# Patient Record
Sex: Male | Born: 1985 | Race: White | Hispanic: No | Marital: Married | Smoking: Current some day smoker
Health system: Southern US, Community
[De-identification: ages and names within clinical notes are randomized; demographics above are authoritative.]

## PROBLEM LIST (undated history)

## (undated) DIAGNOSIS — R319 Hematuria, unspecified: Secondary | ICD-10-CM

## (undated) DIAGNOSIS — N201 Calculus of ureter: Secondary | ICD-10-CM

## (undated) DIAGNOSIS — R3915 Urgency of urination: Secondary | ICD-10-CM

## (undated) DIAGNOSIS — N200 Calculus of kidney: Secondary | ICD-10-CM

## (undated) DIAGNOSIS — Z87442 Personal history of urinary calculi: Secondary | ICD-10-CM

## (undated) HISTORY — DX: Hematuria, unspecified: R31.9

## (undated) HISTORY — PX: NO PAST SURGERIES: SHX2092

---
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
## 2005-04-20 ENCOUNTER — Emergency Department (HOSPITAL_COMMUNITY): Admission: EM | Admit: 2005-04-20 | Discharge: 2005-04-20 | Payer: Self-pay | Admitting: Emergency Medicine

## 2006-12-30 ENCOUNTER — Emergency Department (HOSPITAL_COMMUNITY): Admission: EM | Admit: 2006-12-30 | Discharge: 2006-12-30 | Payer: Self-pay | Admitting: Emergency Medicine

## 2008-04-02 ENCOUNTER — Emergency Department (HOSPITAL_COMMUNITY): Admission: EM | Admit: 2008-04-02 | Discharge: 2008-04-02 | Payer: Self-pay | Admitting: Emergency Medicine

## 2008-04-14 ENCOUNTER — Emergency Department (HOSPITAL_COMMUNITY): Admission: EM | Admit: 2008-04-14 | Discharge: 2008-04-14 | Payer: Self-pay | Admitting: Emergency Medicine

## 2008-10-21 ENCOUNTER — Ambulatory Visit: Payer: Self-pay | Admitting: Family Medicine

## 2008-10-21 LAB — CONVERTED CEMR LAB
Bilirubin Urine: NEGATIVE
Glucose, Urine, Semiquant: NEGATIVE
Ketones, urine, test strip: NEGATIVE
Specific Gravity, Urine: 1.02
Urobilinogen, UA: 0.2
pH: 6

## 2009-04-02 ENCOUNTER — Emergency Department (HOSPITAL_COMMUNITY): Admission: EM | Admit: 2009-04-02 | Discharge: 2009-04-02 | Payer: Self-pay | Admitting: Emergency Medicine

## 2011-03-20 LAB — URINE MICROSCOPIC-ADD ON

## 2011-03-20 LAB — POCT I-STAT, CHEM 8
BUN: 13
Chloride: 107
HCT: 45
Potassium: 3.8
TCO2: 25

## 2011-03-20 LAB — URINALYSIS, ROUTINE W REFLEX MICROSCOPIC
Urobilinogen, UA: 1
pH: 7.5

## 2011-07-04 ENCOUNTER — Emergency Department (HOSPITAL_COMMUNITY)
Admission: EM | Admit: 2011-07-04 | Discharge: 2011-07-04 | Disposition: A | Discharge status: Home / Self Care | Payer: BC Managed Care – PPO | Attending: Emergency Medicine | Admitting: Emergency Medicine

## 2011-07-04 ENCOUNTER — Encounter (HOSPITAL_COMMUNITY): Payer: Self-pay | Admitting: *Deleted

## 2011-07-04 ENCOUNTER — Emergency Department (HOSPITAL_COMMUNITY): Payer: BC Managed Care – PPO

## 2011-07-04 DIAGNOSIS — N23 Unspecified renal colic: Secondary | ICD-10-CM

## 2011-07-04 DIAGNOSIS — R109 Unspecified abdominal pain: Secondary | ICD-10-CM | POA: Insufficient documentation

## 2011-07-04 DIAGNOSIS — N1330 Unspecified hydronephrosis: Secondary | ICD-10-CM | POA: Insufficient documentation

## 2011-07-04 DIAGNOSIS — N201 Calculus of ureter: Secondary | ICD-10-CM | POA: Insufficient documentation

## 2011-07-04 DIAGNOSIS — R0989 Other specified symptoms and signs involving the circulatory and respiratory systems: Secondary | ICD-10-CM | POA: Insufficient documentation

## 2011-07-04 DIAGNOSIS — R0609 Other forms of dyspnea: Secondary | ICD-10-CM | POA: Insufficient documentation

## 2011-07-04 DIAGNOSIS — R61 Generalized hyperhidrosis: Secondary | ICD-10-CM | POA: Insufficient documentation

## 2011-07-04 DIAGNOSIS — R1110 Vomiting, unspecified: Secondary | ICD-10-CM | POA: Insufficient documentation

## 2011-07-04 LAB — URINALYSIS, ROUTINE W REFLEX MICROSCOPIC
Glucose, UA: NEGATIVE mg/dL
Ketones, ur: NEGATIVE mg/dL
Leukocytes, UA: NEGATIVE
Nitrite: NEGATIVE
Urobilinogen, UA: 0.2 mg/dL (ref 0.0–1.0)
pH: 6 (ref 5.0–8.0)

## 2011-07-04 LAB — URINE MICROSCOPIC-ADD ON

## 2011-07-04 LAB — URINE CULTURE: Culture  Setup Time: 201301162004

## 2011-07-04 MED ORDER — HYDROMORPHONE HCL PF 1 MG/ML IJ SOLN
1.0000 mg | Freq: Once | INTRAMUSCULAR | Status: AC
  Administered 2011-07-04: 1 mg via INTRAVENOUS
  Filled 2011-07-04: qty 1

## 2011-07-04 MED ORDER — KETOROLAC TROMETHAMINE 60 MG/2ML IM SOLN
60.0000 mg | Freq: Once | INTRAMUSCULAR | Status: AC
  Administered 2011-07-04: 60 mg via INTRAMUSCULAR
  Filled 2011-07-04: qty 2

## 2011-07-04 MED ORDER — ONDANSETRON HCL 4 MG/2ML IJ SOLN
4.0000 mg | Freq: Once | INTRAMUSCULAR | Status: AC
  Administered 2011-07-04: 4 mg via INTRAVENOUS

## 2011-07-04 MED ORDER — MORPHINE SULFATE 4 MG/ML IJ SOLN
4.0000 mg | INTRAMUSCULAR | Status: DC | PRN
  Filled 2011-07-04: qty 1

## 2011-07-04 MED ORDER — MORPHINE SULFATE 2 MG/ML IJ SOLN
2.0000 mg | INTRAMUSCULAR | Status: DC | PRN
  Filled 2011-07-04 (×2): qty 1

## 2011-07-04 MED ORDER — ONDANSETRON HCL 4 MG PO TABS: 4.0000 mg | ORAL_TABLET | Freq: Four times a day (QID) | ORAL | Status: AC | PRN

## 2011-07-04 MED ORDER — MORPHINE SULFATE 2 MG/ML IJ SOLN
2.0000 mg | Freq: Once | INTRAMUSCULAR | Status: AC
  Administered 2011-07-04: 2 mg via INTRAVENOUS

## 2011-07-04 MED ORDER — SODIUM CHLORIDE 0.9 % IV BOLUS (SEPSIS): 1000.0000 mL | Freq: Once | INTRAVENOUS | Status: DC

## 2011-07-04 MED ORDER — TAMSULOSIN HCL 0.4 MG PO CAPS
0.4000 mg | ORAL_CAPSULE | Freq: Every day | ORAL | Status: DC
  Administered 2011-07-04: 0.4 mg via ORAL
  Filled 2011-07-04: qty 1

## 2011-07-04 MED ORDER — HYDROMORPHONE HCL 2 MG PO TABS
ORAL_TABLET | ORAL | Status: AC
  Filled 2011-07-04: qty 1

## 2011-07-04 MED ORDER — TAMSULOSIN HCL 0.4 MG PO CAPS: 0.4000 mg | ORAL_CAPSULE | Freq: Every day | ORAL | Status: DC

## 2011-07-04 MED ORDER — HYDROMORPHONE HCL 2 MG PO TABS: 1.0000 mg | ORAL_TABLET | ORAL | Status: DC | PRN

## 2011-07-04 MED ORDER — ONDANSETRON HCL 4 MG/2ML IJ SOLN
INTRAMUSCULAR | Status: AC
  Administered 2011-07-04: 4 mg via INTRAVENOUS
  Filled 2011-07-04: qty 2

## 2011-07-04 MED ORDER — OXYCODONE-ACETAMINOPHEN 5-325 MG PO TABS: ORAL_TABLET | ORAL | Status: AC

## 2011-07-04 NOTE — ED Provider Notes (Signed)
History     CSN: [id number]  Arrival date & time 07/04/11  4098   First MD Initiated Contact with Patient 07/04/11 0915      Chief Complaint  Patient presents with  . Flank Pain   HPI  Pt writhing in pain during time of exam. Pt w/ acute onset L lower abdominal pain this am that is sharp and constant in nature w/ some waxing/waning of pain. Pain is diffuse w/o radiation. Denies trauma, fever, recent illness, hematuria. Emesis x1 when EMS arrived at home this morning.    Past Medical History  Diagnosis Date  . Kidney stones     No past surgical history on file.  No family history on file.  Social History: Tobacco 1ppd Etoh: denied Drugs: denied   Review of Systems  Constitutional: Negative for fever, chills, activity change, appetite change and fatigue.  HENT: Negative for congestion and neck stiffness.   Respiratory: Negative for cough, choking, chest tightness, shortness of breath, wheezing and stridor.   Cardiovascular: Negative for chest pain, palpitations and leg swelling.  Gastrointestinal: Positive for abdominal pain. Negative for nausea, vomiting, constipation, blood in stool and abdominal distention.  Genitourinary: Positive for flank pain. Negative for dysuria, urgency, frequency, hematuria, decreased urine volume, discharge, enuresis, difficulty urinating and testicular pain.  Skin: Negative for color change and rash.  Neurological: Negative for dizziness, weakness, numbness and headaches.    Allergies  Review of patient's allergies indicates no known allergies.  Home Medications  No current outpatient prescriptions on file.  BP 121/74  Pulse 54  Temp(Src) 95.6 F (35.3 C) (Oral)  Resp 24  SpO2 100%  Physical Exam  Constitutional: He is oriented to person, place, and time. He appears well-developed and well-nourished. He appears distressed.  HENT:  Head: Normocephalic and atraumatic.  Mouth/Throat: Oropharynx is clear and moist.  Eyes:  Conjunctivae and EOM are normal. Pupils are equal, round, and reactive to light. Right eye exhibits no discharge. Left eye exhibits no discharge. No scleral icterus.  Neck: Normal range of motion.  Cardiovascular: Normal rate, regular rhythm, normal heart sounds and intact distal pulses.  Exam reveals no gallop and no friction rub.   No murmur heard. Pulmonary/Chest: Breath sounds normal. He is in respiratory distress. He has no wheezes. He has no rales.  Abdominal: Soft. Bowel sounds are normal. He exhibits no distension and no mass. There is tenderness. There is no rebound (Left flank tenderness on palpation) and no guarding.       Left flank tenderness on palpation  Musculoskeletal: Normal range of motion.       L CVA tenderness No R CVA tendernss  Neurological: He is alert and oriented to person, place, and time. No cranial nerve deficit.  Skin: Skin is warm. He is diaphoretic.    ED Course  Procedures (including critical care time)   Labs Reviewed  URINALYSIS, ROUTINE W REFLEX MICROSCOPIC  URINE CULTURE   No results found.   No diagnosis found.    MDM   26yo M w/ h/o kidney stone w/ acute onset severe L Flank/CVA pain.   Flank Pain: Likely kidney stone  - Pain: Morphine 2mg  Q4hrs, and toradol 60 IM x1 - CT abdomen/pelvis - UA/UCX - IVF NS bolus - Nausea: zofran IV - continuous pulse ox     Shelly Flatten, MD 07/04/11 1058

## 2011-07-04 NOTE — ED Provider Notes (Signed)
26yo M, c/o sudden onset and persistence of constant left sided flank pain radiating into the left side of his abd that began this morning.  Pt has hx kidney stones, endorses his symptoms today "feels the same."  Has been assoc with N/V.  CT scan with +50mm left UVJ stone, UA without infection.  Feels improved after meds and wants to go home now.  Dx testing d/w pt and family.  Questions answered.  Verb understanding, agreeable to d/c home with outpt f/u.     I saw and evaluated the patient, reviewed the resident's note and I agree with the findings and plan.   Doha Boling Allison Quarry, DO 07/05/11 [phone]

## 2011-07-04 NOTE — ED Notes (Signed)
Patient states he is unable to void at this time. 

## 2011-07-04 NOTE — ED Notes (Signed)
Per EMS pt from home, had sudden onset of left flank pain around 0800. Denies urinary symptoms. Hx of kidney stones. CBG 127.

## 2011-07-05 NOTE — ED Provider Notes (Signed)
I saw and evaluated the patient, reviewed the resident's note and I agree with the findings and plan.  Please seen my separate ED provider note.   Laray Anger, DO 07/05/11 [phone]

## 2016-12-16 ENCOUNTER — Emergency Department (HOSPITAL_COMMUNITY): Payer: Self-pay

## 2016-12-16 ENCOUNTER — Encounter (HOSPITAL_COMMUNITY): Payer: Self-pay | Admitting: Emergency Medicine

## 2016-12-16 ENCOUNTER — Ambulatory Visit (HOSPITAL_COMMUNITY)
Admission: EM | Admit: 2016-12-16 | Discharge: 2016-12-16 | Disposition: A | Discharge status: Nursing Home (Includes ICF) | Payer: Self-pay | Attending: Internal Medicine | Admitting: Internal Medicine

## 2016-12-16 ENCOUNTER — Emergency Department (HOSPITAL_COMMUNITY)
Admission: EM | Admit: 2016-12-16 | Discharge: 2016-12-16 | Disposition: A | Discharge status: Home / Self Care | Payer: Self-pay | Attending: Emergency Medicine | Admitting: Emergency Medicine

## 2016-12-16 DIAGNOSIS — Y9389 Activity, other specified: Secondary | ICD-10-CM | POA: Insufficient documentation

## 2016-12-16 DIAGNOSIS — M25551 Pain in right hip: Secondary | ICD-10-CM

## 2016-12-16 DIAGNOSIS — M545 Low back pain, unspecified: Secondary | ICD-10-CM

## 2016-12-16 DIAGNOSIS — Y929 Unspecified place or not applicable: Secondary | ICD-10-CM | POA: Insufficient documentation

## 2016-12-16 DIAGNOSIS — S32391A Other fracture of right ilium, initial encounter for closed fracture: Secondary | ICD-10-CM | POA: Insufficient documentation

## 2016-12-16 DIAGNOSIS — Y999 Unspecified external cause status: Secondary | ICD-10-CM | POA: Insufficient documentation

## 2016-12-16 DIAGNOSIS — F17210 Nicotine dependence, cigarettes, uncomplicated: Secondary | ICD-10-CM | POA: Insufficient documentation

## 2016-12-16 DIAGNOSIS — M79671 Pain in right foot: Secondary | ICD-10-CM

## 2016-12-16 MED ORDER — HYDROCODONE-ACETAMINOPHEN 5-325 MG PO TABS: 1.0000 | ORAL_TABLET | Freq: Four times a day (QID) | ORAL | 0 refills | Status: DC | PRN

## 2016-12-16 MED ORDER — IBUPROFEN 800 MG PO TABS
800.0000 mg | ORAL_TABLET | Freq: Once | ORAL | Status: AC
Start: 2016-12-16 — End: 2016-12-16
  Administered 2016-12-16: 800 mg via ORAL
  Filled 2016-12-16: qty 1
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
## 2016-12-16 NOTE — Discharge Instructions (Signed)
As discussed, with your pelvis fracture that is important that you rest, monitor your condition carefully, and be sure to follow-up with our orthopedic physician.  In addition to the prescribed pain medication, which should be used to facilitate rest, please use ibuprofen, 600 mg, 3 times daily for the next 3 days.

## 2016-12-16 NOTE — Discharge Instructions (Signed)
Based on your mechanism of injury, along with signs and symptoms, I believe you need a CT scan, we do not have access to that here, I recommend you go to the ER for further evaluation.
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
## 2016-12-16 NOTE — ED Notes (Signed)
Report from Lee Ann, RN  
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
## 2017-07-24 ENCOUNTER — Encounter (HOSPITAL_COMMUNITY): Payer: Self-pay

## 2017-07-24 ENCOUNTER — Emergency Department (HOSPITAL_COMMUNITY): Payer: 59

## 2017-07-24 ENCOUNTER — Other Ambulatory Visit: Payer: Self-pay

## 2017-07-24 ENCOUNTER — Emergency Department (HOSPITAL_COMMUNITY)
Admission: EM | Admit: 2017-07-24 | Discharge: 2017-07-24 | Disposition: A | Discharge status: Home / Self Care | Payer: 59 | Attending: Emergency Medicine | Admitting: Emergency Medicine

## 2017-07-24 DIAGNOSIS — N200 Calculus of kidney: Secondary | ICD-10-CM | POA: Insufficient documentation

## 2017-07-24 DIAGNOSIS — F17200 Nicotine dependence, unspecified, uncomplicated: Secondary | ICD-10-CM | POA: Diagnosis not present

## 2017-07-24 DIAGNOSIS — R1032 Left lower quadrant pain: Secondary | ICD-10-CM | POA: Diagnosis present

## 2017-07-24 LAB — CBC
HCT: 45 % (ref 39.0–52.0)
HEMOGLOBIN: 14.7 g/dL (ref 13.0–17.0)
MCH: 30.3 pg (ref 26.0–34.0)
MCHC: 32.7 g/dL (ref 30.0–36.0)
MCV: 92.8 fL (ref 78.0–100.0)
Platelets: 194 10*3/uL (ref 150–400)
RBC: 4.85 MIL/uL (ref 4.22–5.81)
RDW: 14.2 % (ref 11.5–15.5)
WBC: 8.8 10*3/uL (ref 4.0–10.5)

## 2017-07-24 LAB — COMPREHENSIVE METABOLIC PANEL
ALT: 16 U/L — ABNORMAL LOW (ref 17–63)
ANION GAP: 12 (ref 5–15)
AST: 20 U/L (ref 15–41)
Albumin: 4 g/dL (ref 3.5–5.0)
Alkaline Phosphatase: 61 U/L (ref 38–126)
BUN: 16 mg/dL (ref 6–20)
CHLORIDE: 104 mmol/L (ref 101–111)
CO2: 23 mmol/L (ref 22–32)
Calcium: 8.8 mg/dL — ABNORMAL LOW (ref 8.9–10.3)
Creatinine, Ser: 0.79 mg/dL (ref 0.61–1.24)
GFR calc non Af Amer: >60 mL/min (ref >60)
Glucose, Bld: 162 mg/dL — ABNORMAL HIGH (ref 65–99)
Potassium: 3.7 mmol/L (ref 3.5–5.1)
Sodium: 139 mmol/L (ref 135–145)
Total Bilirubin: 0.3 mg/dL (ref 0.3–1.2)
Total Protein: 6.8 g/dL (ref 6.5–8.1)

## 2017-07-24 LAB — LIPASE, BLOOD: LIPASE: 32 U/L (ref 11–51)

## 2017-07-24 LAB — URINALYSIS, ROUTINE W REFLEX MICROSCOPIC
BILIRUBIN URINE: NEGATIVE
Bacteria, UA: NONE SEEN
Glucose, UA: NEGATIVE mg/dL
KETONES UR: NEGATIVE mg/dL
Nitrite: NEGATIVE
PH: 5 (ref 5.0–8.0)
Protein, ur: NEGATIVE mg/dL
Specific Gravity, Urine: 1.02 (ref 1.005–1.030)

## 2017-07-24 MED ORDER — FENTANYL CITRATE (PF) 100 MCG/2ML IJ SOLN
50.0000 ug | Freq: Once | INTRAMUSCULAR | Status: AC
  Administered 2017-07-24: 50 ug via INTRAVENOUS
  Filled 2017-07-24: qty 2

## 2017-07-24 MED ORDER — KETOROLAC TROMETHAMINE 30 MG/ML IJ SOLN
30.0000 mg | Freq: Once | INTRAMUSCULAR | Status: AC
  Administered 2017-07-24: 30 mg via INTRAVENOUS
  Filled 2017-07-24: qty 1

## 2017-07-24 MED ORDER — OXYCODONE-ACETAMINOPHEN 5-325 MG PO TABS
1.0000 | ORAL_TABLET | ORAL | Status: DC | PRN
  Administered 2017-07-24: 1 via ORAL
  Filled 2017-07-24: qty 1

## 2017-07-24 NOTE — ED Triage Notes (Signed)
Pt states that he woke up this AM with lower abd pain and nausea, states he feels like he has a kidney stone again.

## 2017-07-24 NOTE — ED Notes (Signed)
Patient transported to CT 

## 2017-07-24 NOTE — ED Provider Notes (Signed)
[facility] Provider Note   CSN: [id number] Arrival date & time: 07/24/17  [id number]     History   Chief Complaint Chief Complaint  Patient presents with  . Abdominal Pain    HPI Edgar Campbell is a 32 y.o. male.  Patient is a 32 year old male who presents with left flank pain.  He states yesterday he had a little bit of achiness in his left testicle area.  Today when he was trying to urinate, his urine stream stopped and he had sudden onset of intense pain in his left mid abdomen radiating to his left back.  He has had a history of 2 prior kidney stones.  He is never had to have intervention on that.  His last one was in 2013.  He has had some nausea but no vomiting.  No fevers.  He has not taken anything at home for the pain.  The pain has been constant since it intensified this with pain.      Past Medical History:  Diagnosis Date  . Kidney stones     There are no active problems to display for this patient.   History reviewed. No pertinent surgical history.     Home Medications    Prior to Admission medications   Medication Sig Start Date End Date Taking? Authorizing Provider  HYDROcodone-acetaminophen (NORCO/VICODIN) 5-325 MG tablet Take 1 tablet by mouth every 6 (six) hours as needed for severe pain. 12/16/16   Gerhard MunchLockwood, Robert, MD  ibuprofen (ADVIL,MOTRIN) 600 MG tablet Take 600 mg by mouth every 6 (six) hours as needed.    [provider]    Family History No family history on file.  Social History Social History   Tobacco Use  . Smoking status: Current Every Day Smoker    Packs/day: 1.00  . Smokeless tobacco: Never Used  Substance Use Topics  . Alcohol use: Yes  . Drug use: No     Allergies   Patient has no known allergies.   Review of Systems Review of Systems  Constitutional: Negative for chills, diaphoresis, fatigue and fever.  HENT: Negative for congestion, rhinorrhea and sneezing.   Eyes: Negative.    Respiratory: Negative for cough, chest tightness and shortness of breath.   Cardiovascular: Negative for chest pain and leg swelling.  Gastrointestinal: Positive for abdominal pain and nausea. Negative for blood in stool, diarrhea and vomiting.  Genitourinary: Negative for difficulty urinating, flank pain, frequency and hematuria.  Musculoskeletal: Negative for arthralgias and back pain.  Skin: Negative for rash.  Neurological: Negative for dizziness, speech difficulty, weakness, numbness and headaches.     Physical Exam Updated Vital Signs BP 134/83   Pulse (!) 42   Resp 16   SpO2 100%   Physical Exam  Constitutional: He is oriented to person, place, and time. He appears well-developed and well-nourished.  HENT:  Head: Normocephalic and atraumatic.  Eyes: Pupils are equal, round, and reactive to light.  Neck: Normal range of motion. Neck supple.  Cardiovascular: Normal rate, regular rhythm and normal heart sounds.  Pulmonary/Chest: Effort normal and breath sounds normal. No respiratory distress. He has no wheezes. He has no rales. He exhibits no tenderness.  Abdominal: Soft. Bowel sounds are normal. There is tenderness in the left lower quadrant. There is no rebound and no guarding.  Genitourinary:  Genitourinary Comments: No testicular tenderness  Musculoskeletal: Normal range of motion. He exhibits no edema.  Lymphadenopathy:    He has no cervical adenopathy.  Neurological:  He is alert and oriented to person, place, and time.  Skin: Skin is warm and dry. No rash noted.  Psychiatric: He has a normal mood and affect.     ED Treatments / Results  Labs (all labs ordered are listed, but only abnormal results are displayed) Labs Reviewed  COMPREHENSIVE METABOLIC PANEL - Abnormal; Notable for the following components:      Result Value   Glucose, Bld 162 (*)    Calcium 8.8 (*)    ALT 16 (*)    All other components within normal limits  URINALYSIS, ROUTINE W REFLEX  MICROSCOPIC - Abnormal; Notable for the following components:   Hgb urine dipstick LARGE (*)    Leukocytes, UA TRACE (*)    Squamous Epithelial / LPF 0-5 (*)    All other components within normal limits  LIPASE, BLOOD  CBC    EKG  EKG Interpretation None       Radiology Ct Renal Stone Study  Result Date: 07/24/2017 CLINICAL DATA:  Lower abdominal pain and nausea. History of kidney stones. EXAM: CT ABDOMEN AND PELVIS WITHOUT CONTRAST TECHNIQUE: Multidetector CT imaging of the abdomen and pelvis was performed following the standard protocol without IV contrast. COMPARISON:  07/04/2011. FINDINGS: Lower chest: Negative. Hepatobiliary: Liver measures 19.6 cm. Liver and gallbladder are otherwise unremarkable. No biliary ductal dilatation. Pancreas: Negative. Spleen: Negative. Adrenals/Urinary Tract: Adrenal glands are unremarkable. Stones are seen in the kidneys bilaterally. Right ureter is decompressed. Left renal edema and mild residual left hydronephrosis secondary to passage of a 2 mm stone into the bladder. Bladder is otherwise grossly unremarkable. Stomach/Bowel: Stomach, small bowel, appendix and colon are unremarkable. Vascular/Lymphatic: Vascular structures are unremarkable. No pathologically enlarged lymph nodes. Reproductive: Prostate is normal in size. Other: No free fluid.  Mesenteries and peritoneum are unremarkable. Musculoskeletal: Negative. IMPRESSION: 1. Left renal edema and mild residual left hydronephrosis secondary to passage of a 2 mm stone into the bladder. 2. Bilateral renal stones. 3. Hepatomegaly. Electronically Signed   By: Leanna Battles M.D.   On: 07/24/2017 11:28    Procedures Procedures (including critical care time)  Medications Ordered in ED Medications  oxyCODONE-acetaminophen (PERCOCET/ROXICET) 5-325 MG per tablet 1 tablet (1 tablet Oral Given 07/24/17 0709)  fentaNYL (SUBLIMAZE) injection 50 mcg (50 mcg Intravenous Given 07/24/17 1054)  ketorolac (TORADOL) 30  MG/ML injection 30 mg (30 mg Intravenous Given 07/24/17 1054)     Initial Impression / Assessment and Plan / ED Course  I have reviewed the triage vital signs and the nursing notes.  Pertinent labs & imaging results that were available during my care of the patient were reviewed by me and considered in my medical decision making (see chart for details).     Patient is a 32 year old male who presents with left flank pain.  CT scan shows a recently passed 2 mm stone.  Patient's pain is completely resolved after treatment in the ED.  His urine shows no signs of infection.  He was discharged home in good condition.  His blood sugar was elevated and I did advise him that he will need to have this rechecked by her primary care physician.  Return precautions were given.  Final Clinical Impressions(s) / ED Diagnoses   Final diagnoses:  Kidney stone    ED Discharge Orders    None       Rolan Bucco, MD 07/24/17 1318

## 2018-06-03 ENCOUNTER — Other Ambulatory Visit: Payer: Self-pay

## 2018-06-03 ENCOUNTER — Encounter (HOSPITAL_COMMUNITY): Payer: Self-pay | Admitting: Emergency Medicine

## 2018-06-03 ENCOUNTER — Ambulatory Visit (HOSPITAL_COMMUNITY)
Admission: EM | Admit: 2018-06-03 | Discharge: 2018-06-03 | Disposition: A | Discharge status: Home / Self Care | Payer: 59 | Attending: Family Medicine | Admitting: Family Medicine

## 2018-06-03 DIAGNOSIS — B029 Zoster without complications: Secondary | ICD-10-CM

## 2018-06-03 DIAGNOSIS — J069 Acute upper respiratory infection, unspecified: Secondary | ICD-10-CM | POA: Insufficient documentation

## 2018-06-03 MED ORDER — CAPSAICIN 0.1 % EX CREA: TOPICAL_CREAM | CUTANEOUS | 0 refills | Status: DC

## 2018-06-03 MED ORDER — VALACYCLOVIR HCL 1 G PO TABS: 1000.0000 mg | ORAL_TABLET | Freq: Three times a day (TID) | ORAL | 0 refills | Status: DC

## 2018-06-03 NOTE — Discharge Instructions (Signed)
You may use over the counter ibuprofen or acetaminophen as needed.  ° °

## 2018-06-03 NOTE — ED Triage Notes (Signed)
PT has a painful, vesicular rash to right ribs for 5 days.  Congestion/ sinus/ headache started yesterday.

## 2018-06-04 NOTE — ED Provider Notes (Signed)
[facility]   [id number] 06/03/18 Arrival Time: 1633  ASSESSMENT & PLAN:  1. Herpes zoster without complication   2. Viral upper respiratory tract infection    No signs of skin bacterial infection or abscess.  Meds ordered this encounter  Medications  . valACYclovir (VALTREX) 1000 MG tablet    Sig: Take 1 tablet (1,000 mg total) by mouth 3 (three) times daily.    Dispense:  21 tablet    Refill:  0  . Capsaicin 0.1 % CREA    Sig: Apply topically 2-3 times a day as needed.    Dispense:  56.6 g    Refill:  0   OTC for cold symptoms. Doubt influenza. Discussed.  Will follow up with PCP or here if worsening or failing to improve as anticipated. Reviewed expectations re: course of current medical issues. Questions answered. Outlined signs and symptoms indicating need for more acute intervention. Patient verbalized understanding. After Visit Summary given.   SUBJECTIVE:  Edgar Campbell is a 32 y.o. male who presents with a skin complaint.   Location: R side onto back Onset: gradual Duration: 3-4 days ago Associated pruritis? none Associated pain? Mild discomfort. Progression: increasing steadily  Drainage? No  Known trigger? No  New soaps/lotions/topicals/detergents/environmental exposures? No Contacts with similar? No Recent travel? No  Other associated symptoms: none Therapies tried thus far: none Arthralgia or myalgia? none Recent illness? none Fever? none No specific aggravating or alleviating factors reported. No h/o similar.  Also reports mild nasal congestion over the past 1-2 days. Sinus congestion. Afebrile. No respiratory symptoms. Mild headache. No n/v/d. No OTC treatment.  ROS: As per HPI. All other systems negative.   OBJECTIVE: Vitals:   06/03/18 1702  BP: 125/79  Pulse: 66  Resp: 16  Temp: 98 F (36.7 C)  TempSrc: Oral  SpO2: 98%    General appearance: alert; no distress HEENT: nasal congestion; throat appears normal Neck: supple  with FROM; no LAD Lungs: clear to auscultation bilaterally Heart: regular rate and rhythm Abd: soft; non-tender (see skin exam below) Extremities: no edema Skin: warm and dry; 2 large crops of red/purplish papules over T8-T9 dermatome on the R side and back; no crusting; mild tenderness to touch; no surrounding erythema Psychological: alert and cooperative; normal mood and affect  No Known Allergies  Past Medical History:  Diagnosis Date  . Kidney stones    Social History   Socioeconomic History  . Marital status: Married    Spouse name: Not on file  . Number of children: Not on file  . Years of education: Not on file  . Highest education level: Not on file  Occupational History  . Not on file  Social Needs  . Financial resource strain: Not on file  . Food insecurity:    Worry: Not on file    Inability: Not on file  . Transportation needs:    Medical: Not on file    Non-medical: Not on file  Tobacco Use  . Smoking status: Current Every Day Smoker    Packs/day: 1.00  . Smokeless tobacco: Never Used  Substance and Sexual Activity  . Alcohol use: Yes  . Drug use: No  . Sexual activity: Yes  Lifestyle  . Physical activity:    Days per week: Not on file    Minutes per session: Not on file  . Stress: Not on file  Relationships  . Social connections:    Talks on phone: Not on file    Gets together:  Not on file    Attends religious service: Not on file    Active member of club or organization: Not on file    Attends meetings of clubs or organizations: Not on file    Relationship status: Not on file  . Intimate partner violence:    Fear of current or ex partner: Not on file    Emotionally abused: Not on file    Physically abused: Not on file    Forced sexual activity: Not on file  Other Topics Concern  . Not on file  Social History Narrative  . Not on file    History reviewed. No pertinent surgical history.   Mardella LaymanHagler, Terrance Usery, MD 06/04/18 1024

## 2018-07-08 ENCOUNTER — Emergency Department (HOSPITAL_COMMUNITY)
Admission: EM | Admit: 2018-07-08 | Discharge: 2018-07-08 | Disposition: A | Discharge status: Home / Self Care | Payer: 59 | Attending: Emergency Medicine | Admitting: Emergency Medicine

## 2018-07-08 ENCOUNTER — Encounter (HOSPITAL_COMMUNITY): Payer: Self-pay | Admitting: Emergency Medicine

## 2018-07-08 DIAGNOSIS — N201 Calculus of ureter: Secondary | ICD-10-CM

## 2018-07-08 DIAGNOSIS — F17200 Nicotine dependence, unspecified, uncomplicated: Secondary | ICD-10-CM | POA: Diagnosis not present

## 2018-07-08 DIAGNOSIS — R1084 Generalized abdominal pain: Secondary | ICD-10-CM | POA: Diagnosis not present

## 2018-07-08 LAB — URINALYSIS, ROUTINE W REFLEX MICROSCOPIC
BACTERIA UA: NONE SEEN
Bilirubin Urine: NEGATIVE
Glucose, UA: NEGATIVE mg/dL
Ketones, ur: NEGATIVE mg/dL
Nitrite: NEGATIVE
PROTEIN: 100 mg/dL — AB
RBC / HPF: >50 RBC/hpf — ABNORMAL HIGH (ref 0–5)
Specific Gravity, Urine: 1.019 (ref 1.005–1.030)
pH: 6 (ref 5.0–8.0)

## 2018-07-08 MED ORDER — KETOROLAC TROMETHAMINE 30 MG/ML IJ SOLN
30.0000 mg | Freq: Once | INTRAMUSCULAR | Status: AC
  Administered 2018-07-08: 30 mg via INTRAVENOUS
  Filled 2018-07-08: qty 1

## 2018-07-08 MED ORDER — FENTANYL CITRATE (PF) 100 MCG/2ML IJ SOLN
100.0000 ug | Freq: Once | INTRAMUSCULAR | Status: AC
Start: 2018-07-08 — End: 2018-07-08
  Administered 2018-07-08: 100 ug via INTRAVENOUS
  Filled 2018-07-08: qty 2

## 2018-07-08 MED ORDER — HYDROCODONE-ACETAMINOPHEN 5-325 MG PO TABS
1.0000 | ORAL_TABLET | Freq: Once | ORAL | Status: AC
  Administered 2018-07-08: 1 via ORAL
  Filled 2018-07-08: qty 1

## 2018-07-08 MED ORDER — SODIUM CHLORIDE 0.9 % IV BOLUS (SEPSIS)
1000.0000 mL | Freq: Once | INTRAVENOUS | Status: AC
  Administered 2018-07-08: 1000 mL via INTRAVENOUS

## 2018-07-08 MED ORDER — TAMSULOSIN HCL 0.4 MG PO CAPS: 0.4000 mg | ORAL_CAPSULE | Freq: Every day | ORAL | 0 refills | Status: DC

## 2018-07-08 MED ORDER — HYDROMORPHONE HCL 1 MG/ML IJ SOLN
1.0000 mg | Freq: Once | INTRAMUSCULAR | Status: AC
  Administered 2018-07-08: 1 mg via INTRAVENOUS
  Filled 2018-07-08: qty 1

## 2018-07-08 MED ORDER — HYDROCODONE-ACETAMINOPHEN 5-325 MG PO TABS: 1.0000 | ORAL_TABLET | Freq: Four times a day (QID) | ORAL | 0 refills | Status: DC | PRN

## 2018-07-08 MED ORDER — ONDANSETRON HCL 4 MG/2ML IJ SOLN
4.0000 mg | Freq: Once | INTRAMUSCULAR | Status: AC
  Administered 2018-07-08: 4 mg via INTRAVENOUS
  Filled 2018-07-08: qty 2

## 2018-07-08 NOTE — ED Triage Notes (Signed)
Pt reports left sided flank pain that started around 2am.  Pt had one episode of emesis this morning.  Hx of kidney stones.

## 2018-07-08 NOTE — ED Provider Notes (Signed)
[facility] Provider Note   CSN: [id number] Arrival date & time: 07/08/18  0556     History   Chief Complaint Chief Complaint  Patient presents with  . Flank Pain    HPI Edgar Campbell is a 33 y.o. male.  The history is provided by the patient.  Flank Pain  This is a new problem. The current episode started 3 to 5 hours ago. The problem occurs constantly. The problem has been rapidly worsening. Associated symptoms include abdominal pain. Pertinent negatives include no chest pain. Nothing aggravates the symptoms. Nothing relieves the symptoms.  Patient with previous history of kidney stones presents with abrupt onset of left flank pain.  He reports it radiates down to his left groin.  He reports nausea/vomiting.  No fevers.  This is very similar to prior episodes He has never required urologic intervention Past Medical History:  Diagnosis Date  . Kidney stones     There are no active problems to display for this patient.   History reviewed. No pertinent surgical history.      Home Medications    Prior to Admission medications   Medication Sig Start Date End Date Taking? Authorizing Provider  Capsaicin 0.1 % CREA Apply topically 2-3 times a day as needed. 06/03/18   Mardella LaymanHagler, Brian, MD  ibuprofen (ADVIL,MOTRIN) 600 MG tablet Take 600 mg by mouth every 6 (six) hours as needed.    [provider]  valACYclovir (VALTREX) 1000 MG tablet Take 1 tablet (1,000 mg total) by mouth 3 (three) times daily. 06/03/18   Mardella LaymanHagler, Brian, MD    Family History No family history on file.  Social History Social History   Tobacco Use  . Smoking status: Current Every Day Smoker    Packs/day: 1.00  . Smokeless tobacco: Never Used  Substance Use Topics  . Alcohol use: Yes  . Drug use: No     Allergies   Patient has no known allergies.   Review of Systems Review of Systems  Constitutional: Negative for fever.  Cardiovascular: Negative  for chest pain.  Gastrointestinal: Positive for abdominal pain, nausea and vomiting.  Genitourinary: Positive for flank pain and hematuria.  All other systems reviewed and are negative.    Physical Exam Updated Vital Signs BP (!) 155/98 (BP Location: Right Arm)   Pulse (!) 51   Temp 98.3 F (36.8 C) (Oral)   Resp 16   Ht 1.753 m (5\' 9" )   Wt 54.4 kg   SpO2 100%   BMI 17.72 kg/m   Physical Exam CONSTITUTIONAL: Well developed/well nourished, uncomfortable appearing, diaphoretic HEAD: Normocephalic/atraumatic EYES: EOMI/PERRL ENMT: Mucous membranes moist NECK: supple no meningeal signs SPINE/BACK:entire spine nontender CV: S1/S2 noted, no murmurs/rubs/gallops noted LUNGS: Lungs are clear to auscultation bilaterally, no apparent distress ABDOMEN: soft, nontender, no rebound or guarding, bowel sounds noted throughout abdomen GU: Left Cva tenderness, no testicular tenderness, no hernia, nurse present for exam NEURO: Pt is awake/alert/appropriate, moves all extremitiesx4.  No facial droop.   EXTREMITIES: pulses normal/equal, full ROM SKIN: warm, color normal PSYCH: no abnormalities of mood noted, alert and oriented to situation   ED Treatments / Results  Labs (all labs ordered are listed, but only abnormal results are displayed) Labs Reviewed  URINALYSIS, ROUTINE W REFLEX MICROSCOPIC    EKG None  Radiology No results found.  Procedures Procedures (including critical care time)  Medications Ordered in ED Medications  sodium chloride 0.9 % bolus 1,000 mL (1,000 mLs Intravenous New Bag/Given  07/08/18 0619)  ondansetron (ZOFRAN) injection 4 mg (4 mg Intravenous Given 07/08/18 0618)  fentaNYL (SUBLIMAZE) injection 100 mcg (100 mcg Intravenous Given 07/08/18 0619)  ketorolac (TORADOL) 30 MG/ML injection 30 mg (30 mg Intravenous Given 07/08/18 7741)     Initial Impression / Assessment and Plan / ED Course  I have reviewed the triage vital signs and the nursing  notes.  Pertinent labs results that were available during my care of the patient were reviewed by me and considered in my medical decision making (see chart for details).     7:25 AM Pt with long h/o kidney stones, presenting with left flank pain He is now feeling improved Plan at signout to dr Criss Alvine, f/u on urinalysis, ensure pt can take PO.  Defer imaging for now   Final Clinical Impressions(s) / ED Diagnoses   Final diagnoses:  Flank pain    ED Discharge Orders         Ordered    HYDROcodone-acetaminophen (NORCO/VICODIN) 5-325 MG tablet  Every 6 hours PRN     07/08/18 0724           Zadie Rhine, MD 07/08/18 [phone]

## 2018-07-08 NOTE — ED Notes (Signed)
Patient reminded again that we need urine specimen.

## 2018-07-08 NOTE — ED Provider Notes (Signed)
Care transferred to me.  Urine shows large blood and 6-10 WBC but no bacteria.  This seems unlikely to be infected and I think this all represents inflammation and the likely ureteral stone.  I agree with no imaging at this time, especially as the patient is comfortable and pain is controlled.  He has been given Norco here and upon discharge.  Continue ibuprofen.  Will refer to urology if not improving.  He is asking for Flomax as it has helped in the past.  I discussed he needs to still drink plenty of fluids as this can get him dehydrated by think this is reasonable.  Results for orders placed or performed during the hospital encounter of 07/08/18  Urinalysis, Routine w reflex microscopic- may I&O cath if menses  Result Value Ref Range   Color, Urine YELLOW YELLOW   APPearance HAZY (A) CLEAR   Specific Gravity, Urine 1.019 1.005 - 1.030   pH 6.0 5.0 - 8.0   Glucose, UA NEGATIVE NEGATIVE mg/dL   Hgb urine dipstick LARGE (A) NEGATIVE   Bilirubin Urine NEGATIVE NEGATIVE   Ketones, ur NEGATIVE NEGATIVE mg/dL   Protein, ur 357 (A) NEGATIVE mg/dL   Nitrite NEGATIVE NEGATIVE   Leukocytes, UA TRACE (A) NEGATIVE   RBC / HPF >50 (H) 0 - 5 RBC/hpf   WBC, UA 6-10 0 - 5 WBC/hpf   Bacteria, UA NONE SEEN NONE SEEN   Squamous Epithelial / LPF 0-5 0 - 5   Mucus PRESENT    Hyaline Casts, UA PRESENT    No results found.    Pricilla Loveless, MD 07/08/18 [id number]

## 2018-07-08 NOTE — Discharge Instructions (Signed)
If you develop fever, uncontrolled vomiting, burning when you urinate, symptoms of a urinary tract infection, severe pain, or any other new/concerning symptoms then return to the ER for evaluation.  Follow-up with a urologist if not improving.

## 2018-07-17 ENCOUNTER — Emergency Department (HOSPITAL_COMMUNITY)
Admission: EM | Admit: 2018-07-17 | Discharge: 2018-07-17 | Disposition: A | Discharge status: Home / Self Care | Payer: 59 | Attending: Emergency Medicine | Admitting: Emergency Medicine

## 2018-07-17 ENCOUNTER — Emergency Department (HOSPITAL_COMMUNITY): Payer: 59

## 2018-07-17 ENCOUNTER — Other Ambulatory Visit: Payer: Self-pay

## 2018-07-17 ENCOUNTER — Encounter (HOSPITAL_COMMUNITY): Payer: Self-pay | Admitting: Emergency Medicine

## 2018-07-17 DIAGNOSIS — N23 Unspecified renal colic: Secondary | ICD-10-CM | POA: Diagnosis not present

## 2018-07-17 DIAGNOSIS — N200 Calculus of kidney: Secondary | ICD-10-CM

## 2018-07-17 DIAGNOSIS — Z79899 Other long term (current) drug therapy: Secondary | ICD-10-CM | POA: Insufficient documentation

## 2018-07-17 DIAGNOSIS — F17210 Nicotine dependence, cigarettes, uncomplicated: Secondary | ICD-10-CM | POA: Diagnosis not present

## 2018-07-17 DIAGNOSIS — N201 Calculus of ureter: Secondary | ICD-10-CM

## 2018-07-17 DIAGNOSIS — R109 Unspecified abdominal pain: Secondary | ICD-10-CM

## 2018-07-17 LAB — BASIC METABOLIC PANEL
ANION GAP: 13 (ref 5–15)
BUN: 16 mg/dL (ref 6–20)
CHLORIDE: 99 mmol/L (ref 98–111)
CO2: 25 mmol/L (ref 22–32)
Calcium: 9.6 mg/dL (ref 8.9–10.3)
Creatinine, Ser: 0.87 mg/dL (ref 0.61–1.24)
GFR calc Af Amer: >60 mL/min (ref >60)
Glucose, Bld: 162 mg/dL — ABNORMAL HIGH (ref 70–99)
Potassium: 3.8 mmol/L (ref 3.5–5.1)
SODIUM: 137 mmol/L (ref 135–145)

## 2018-07-17 LAB — CBC WITH DIFFERENTIAL/PLATELET
Abs Immature Granulocytes: 0.09 10*3/uL — ABNORMAL HIGH (ref 0.00–0.07)
BASOS PCT: 0 %
Basophils Absolute: 0 10*3/uL (ref 0.0–0.1)
Eosinophils Absolute: 0 10*3/uL (ref 0.0–0.5)
Eosinophils Relative: 0 %
HCT: 42.5 % (ref 39.0–52.0)
HEMOGLOBIN: 13.7 g/dL (ref 13.0–17.0)
IMMATURE GRANULOCYTES: 1 %
LYMPHS PCT: 6 %
Lymphs Abs: 1 10*3/uL (ref 0.7–4.0)
MCH: 29.5 pg (ref 26.0–34.0)
MCHC: 32.2 g/dL (ref 30.0–36.0)
MCV: 91.4 fL (ref 80.0–100.0)
MONO ABS: 1.1 10*3/uL — AB (ref 0.1–1.0)
MONOS PCT: 7 %
NEUTROS PCT: 86 %
Neutro Abs: 14.9 10*3/uL — ABNORMAL HIGH (ref 1.7–7.7)
PLATELETS: 210 10*3/uL (ref 150–400)
RBC: 4.65 MIL/uL (ref 4.22–5.81)
RDW: 12.7 % (ref 11.5–15.5)
WBC: 17.2 10*3/uL — ABNORMAL HIGH (ref 4.0–10.5)
nRBC: 0 % (ref 0.0–0.2)

## 2018-07-17 LAB — URINALYSIS, ROUTINE W REFLEX MICROSCOPIC
BILIRUBIN URINE: NEGATIVE
Bacteria, UA: NONE SEEN
GLUCOSE, UA: NEGATIVE mg/dL
KETONES UR: 20 mg/dL — AB
LEUKOCYTES UA: NEGATIVE
NITRITE: NEGATIVE
PROTEIN: NEGATIVE mg/dL
Specific Gravity, Urine: 1.018 (ref 1.005–1.030)
pH: 5 (ref 5.0–8.0)

## 2018-07-17 MED ORDER — ONDANSETRON HCL 4 MG/2ML IJ SOLN
4.0000 mg | Freq: Once | INTRAMUSCULAR | Status: AC
  Administered 2018-07-17: 4 mg via INTRAVENOUS
  Filled 2018-07-17: qty 2

## 2018-07-17 MED ORDER — HYDROMORPHONE HCL 1 MG/ML IJ SOLN
1.0000 mg | Freq: Once | INTRAMUSCULAR | Status: AC
Start: 2018-07-17 — End: 2018-07-17
  Administered 2018-07-17: 1 mg via INTRAVENOUS
  Filled 2018-07-17: qty 1

## 2018-07-17 MED ORDER — SODIUM CHLORIDE 0.9 % IV BOLUS (SEPSIS)
1000.0000 mL | Freq: Once | INTRAVENOUS | Status: AC
  Administered 2018-07-17: 1000 mL via INTRAVENOUS

## 2018-07-17 MED ORDER — HYDROCODONE-ACETAMINOPHEN 5-325 MG PO TABS: 1.0000 | ORAL_TABLET | Freq: Four times a day (QID) | ORAL | 0 refills | Status: DC | PRN

## 2018-07-17 MED ORDER — KETOROLAC TROMETHAMINE 30 MG/ML IJ SOLN
30.0000 mg | Freq: Once | INTRAMUSCULAR | Status: AC
  Administered 2018-07-17: 30 mg via INTRAVENOUS
  Filled 2018-07-17: qty 1

## 2018-07-17 MED ORDER — HYDROMORPHONE HCL 1 MG/ML IJ SOLN
1.0000 mg | Freq: Once | INTRAMUSCULAR | Status: AC
  Administered 2018-07-17: 1 mg via INTRAVENOUS
  Filled 2018-07-17: qty 1

## 2018-07-17 MED ORDER — TAMSULOSIN HCL 0.4 MG PO CAPS: 0.4000 mg | ORAL_CAPSULE | Freq: Every day | ORAL | 0 refills | Status: DC

## 2018-07-17 MED ORDER — HYDROMORPHONE HCL 1 MG/ML IJ SOLN: 1.0000 mg | Freq: Once | INTRAMUSCULAR | Status: DC

## 2018-07-17 NOTE — ED Provider Notes (Signed)
[facility] EMERGENCY DEPARTMENT Provider Note   CSN: [id number] Arrival date & time: 07/17/18  0121     History   Chief Complaint Chief Complaint  Patient presents with  . Flank Pain    HPI Edgar Campbell is a 33 y.o. male.  The history is provided by the patient.  Flank Pain  This is a new problem. The current episode started 6 to 12 hours ago. The problem occurs constantly. The problem has been rapidly worsening. Associated symptoms include abdominal pain. Nothing aggravates the symptoms. Nothing relieves the symptoms.   Pt with known history of kidney stones presents with right-sided flank pain.  He reports that he had nausea/vomiting.  He reports this feels similar to prior episodes of kidney stones.  He had a recent bout of this and was seen in the ER earlier this month. Past Medical History:  Diagnosis Date  . Kidney stones     There are no active problems to display for this patient.   History reviewed. No pertinent surgical history.      Home Medications    Prior to Admission medications   Medication Sig Start Date End Date Taking? Authorizing Provider  Capsaicin 0.1 % CREA Apply topically 2-3 times a day as needed. Patient not taking: Reported on 07/08/2018 06/03/18   Mardella Layman, MD  HYDROcodone-acetaminophen (NORCO/VICODIN) 5-325 MG tablet Take 1 tablet by mouth every 6 (six) hours as needed for severe pain. 07/08/18   Zadie Rhine, MD  ibuprofen (ADVIL,MOTRIN) 600 MG tablet Take 600 mg by mouth every 6 (six) hours as needed for moderate pain.     [provider]  tamsulosin (FLOMAX) 0.4 MG CAPS capsule Take 1 capsule (0.4 mg total) by mouth daily. 07/08/18   Pricilla Loveless, MD  valACYclovir (VALTREX) 1000 MG tablet Take 1 tablet (1,000 mg total) by mouth 3 (three) times daily. Patient taking differently: Take 1,000 mg by mouth daily as needed (outbreaks).  06/03/18   Mardella Layman, MD    Family History No family history on  file.  Social History Social History   Tobacco Use  . Smoking status: Current Every Day Smoker    Packs/day: 1.00  . Smokeless tobacco: Never Used  Substance Use Topics  . Alcohol use: Yes  . Drug use: No     Allergies   Patient has no known allergies.   Review of Systems Review of Systems  Constitutional: Negative for fever.  Gastrointestinal: Positive for abdominal pain, nausea and vomiting.  Genitourinary: Positive for flank pain.  All other systems reviewed and are negative.    Physical Exam Updated Vital Signs BP 123/87   Pulse (!) 46   Temp (!) 97.4 F (36.3 C) (Oral)   Resp 12   SpO2 95%   Physical Exam CONSTITUTIONAL: Anxious and hyperventilating HEAD: Normocephalic/atraumatic EYES: EOMI/PERRL ENMT: Mucous membranes moist NECK: supple no meningeal signs SPINE/BACK:entire spine nontender CV: S1/S2 noted, no murmurs/rubs/gallops noted LUNGS: Lungs are clear to auscultation bilaterally, no apparent distress ABDOMEN: soft, diffuse tenderness, no rebound or guarding, bowel sounds noted throughout abdomen GU: Right Cva tenderness NEURO: Pt is awake/alert/appropriate, moves all extremitiesx4.  No facial droop.   EXTREMITIES: pulses normal/equal, full ROM SKIN: warm, color normal PSYCH: anxious  ED Treatments / Results  Labs (all labs ordered are listed, but only abnormal results are displayed) Labs Reviewed  CBC WITH DIFFERENTIAL/PLATELET - Abnormal; Notable for the following components:      Result Value   WBC 17.2 (*)  Neutro Abs 14.9 (*)    Monocytes Absolute 1.1 (*)    Abs Immature Granulocytes 0.09 (*)    All other components within normal limits  BASIC METABOLIC PANEL - Abnormal; Notable for the following components:   Glucose, Bld 162 (*)    All other components within normal limits  URINALYSIS, ROUTINE W REFLEX MICROSCOPIC - Abnormal; Notable for the following components:   Hgb urine dipstick LARGE (*)    Ketones, ur 20 (*)    All other  components within normal limits    EKG None  Radiology Ct Renal Stone Study  Result Date: 07/17/2018 CLINICAL DATA:  Right-sided flank pain EXAM: CT ABDOMEN AND PELVIS WITHOUT CONTRAST TECHNIQUE: Multidetector CT imaging of the abdomen and pelvis was performed following the standard protocol without IV contrast. COMPARISON:  CT 07/24/2017 FINDINGS: Lower chest: Lung bases demonstrate no acute consolidation or effusion. The heart size is normal Hepatobiliary: No focal liver abnormality is seen. No gallstones, gallbladder wall thickening, or biliary dilatation. Pancreas: Unremarkable. No pancreatic ductal dilatation or surrounding inflammatory changes. Spleen: Normal in size without focal abnormality. Adrenals/Urinary Tract: Adrenal glands are normal. Intrarenal stones within both kidneys. On the right, lower pole stone measures up to 11 mm. On the left, lower pole stone measures up to 3 mm. Moderate right hydronephrosis and hydroureter, secondary to a 4 mm stone at the right UVJ. 7 mm oblong calcification in the left posterior pelvis in the vicinity of distal ureter, possibly also reflecting stone although no significant obstruction is noted upstream to this. Stomach/Bowel: Stomach is within normal limits. No evidence of bowel wall thickening, distention, or inflammatory changes. Vascular/Lymphatic: No significant vascular findings are present. No enlarged abdominal or pelvic lymph nodes. Reproductive: Prostate is unremarkable. Other: Negative for free air or free fluid Musculoskeletal: Limbus vertebra at L4. No acute or suspicious abnormality IMPRESSION: 1. Moderate right hydronephrosis and hydroureter, secondary to a 4 mm stone at the right UVJ 2. 7 mm oblong calcification in the left pelvis, appears to localize to the distal left ureter about a cm proximal to the left UVJ however no significant hydronephrosis or hydroureter associated with this finding. 3. Intrarenal stones bilaterally Electronically  Signed   By: Jasmine Pang M.D.   On: 07/17/2018 03:43    Procedures Procedures    Medications Ordered in ED Medications  HYDROmorphone (DILAUDID) injection 1 mg (1 mg Intravenous Given 07/17/18 0315)  ketorolac (TORADOL) 30 MG/ML injection 30 mg (30 mg Intravenous Given 07/17/18 0315)  ondansetron (ZOFRAN) injection 4 mg (4 mg Intravenous Given 07/17/18 0314)  sodium chloride 0.9 % bolus 1,000 mL (0 mLs Intravenous Stopped 07/17/18 0649)  HYDROmorphone (DILAUDID) injection 1 mg (1 mg Intravenous Given 07/17/18 0654)     Initial Impression / Assessment and Plan / ED Course  I have reviewed the triage vital signs and the nursing notes.  Pertinent labs  results that were available during my care of the patient were reviewed by me and considered in my medical decision making (see chart for details).     3:46 AM Presents for recurrent flank pain, with recent ED visit, will proceed with CT imaging. 7:30 AM CT reveals bilateral kidney stones as well as right ureteral stone. Overall he is improved.  He is not septic appearing.  No signs of UTI. He will call urology today for follow-up.  He has had good response to Flomax in the past.  Will re-prescribe narcotics for him due to recurrence of pain Final Clinical Impressions(s) /  ED Diagnoses   Final diagnoses:  Flank pain  Kidney stone  Ureteral colic  Right ureteral stone    ED Discharge Orders         Ordered    HYDROcodone-acetaminophen (NORCO/VICODIN) 5-325 MG tablet  Every 6 hours PRN     07/17/18 0727    tamsulosin (FLOMAX) 0.4 MG CAPS capsule  Daily     07/17/18 0727           Zadie RhineWickline, Haylynn Pha, MD 07/17/18 [phone]

## 2018-07-17 NOTE — ED Notes (Signed)
Patient verbalizes understanding of discharge instructions. Opportunity for questioning and answers were provided. Armband removed by staff, pt discharged from ED ambulatory.   

## 2018-07-17 NOTE — ED Triage Notes (Signed)
C/o R sided flank pain, nausea, vomiting, and dysuria since 8pm.  States he was seen here on 1/21 for L sided kidney stone.

## 2018-07-17 NOTE — ED Notes (Signed)
Patient transported to CT 

## 2018-07-17 NOTE — ED Notes (Signed)
Pt returned from CT °

## 2018-07-21 ENCOUNTER — Other Ambulatory Visit: Payer: Self-pay

## 2018-07-21 ENCOUNTER — Other Ambulatory Visit: Payer: Self-pay | Admitting: Urology

## 2018-07-21 ENCOUNTER — Encounter (HOSPITAL_BASED_OUTPATIENT_CLINIC_OR_DEPARTMENT_OTHER): Payer: Self-pay | Admitting: *Deleted

## 2018-07-21 NOTE — Progress Notes (Signed)
Spoke w/ pt via phone for pre-op interview.  Npo after mn.  Arrive at [id number].  Current lab results dated 04-17-2019.  Will take flomax am dos w/ sips of water and if needed take hydrocodone.

## 2018-07-23 NOTE — H&P (Signed)
CC: I have ureteral stone.  HPI: Edgar Campbell is a 33 year-old male patient who is here for ureteral stone.  The problem is on both sides.   Edgar Campbell is a 33 yo WF who is I last saw in 1/13. He is sent from the ER for bilateral ureteral stones. He had the onset of left flank pain on 07/08/17 and was treated for a stone without imaging because of his prior history. He had gross hematuria then and it is better but not gone. He then had right flank pain on 07/17/17 and went back to the ER and a CT showed a 25m RUVJ stone and a 763mLeft distal stone. He also had renal stones with a 70m4mUP stone that has grown over the last year. He has had prior stones. He has never had to have any procedures. He has a lot of urgency. He has no fever but does have nausea with the pain.      ALLERGIES: No Allergies    MEDICATIONS: Hydrocodone-Acetaminophen  Tamsulosin Hcl 0.4 mg capsule Oral     GU PSH: None     PSH Notes: No Surgical Problems   NON-GU PSH: None   GU PMH: Hydronephrosis Unspec, Hydronephrosis, left - 2014 Other microscopic hematuria, Microscopic hematuria - 2014 Ureteral calculus, Distal Ureteral Stone On The Left - 2014      PMH Notes:  1898-06-18 00:00:00 - Note: Normal Routine History And Physical Adult  2011-07-16 09:41:49 - Note: Closed Colles' Fracture Of The Right Wrist   NON-GU PMH: Personal history of (healed) traumatic fracture, History of fracture of pelvis - 2014 Renal leak hypercalciuria, Nephrocalcinosis - 2014    FAMILY HISTORY: Family Health Status - Mother's Age - Runs In Family Family Health Status Father - Runs In Family Family Health Status Number - Runs In Family Hematuria - Father testicular cancer - Father   SOCIAL HISTORY: Marital Status: Married Preferred Language: English; Race: White Current Smoking Status: Patient does not smoke anymore.   Tobacco Use Assessment Completed: Used Tobacco in last 30 days? Drinks 3 drinks per day.  Drinks 2 caffeinated  drinks per day.     Notes: Tobacco use, Alcohol Use, Occupation:, Caffeine Use, Wishing To Stop Smoking, Using Marijuana, Marital History - Single   REVIEW OF SYSTEMS:    GU Review Male:   Patient reports frequent urination, burning/ pain with urination, stream starts and stops, trouble starting your stream, and penile pain. Patient denies hard to postpone urination, get up at night to urinate, leakage of urine, have to strain to urinate , and erection problems.  Gastrointestinal (Upper):   Patient reports nausea and vomiting. Patient denies indigestion/ heartburn.  Gastrointestinal (Lower):   Patient denies diarrhea and constipation.  Constitutional:   Patient denies fever, night sweats, weight loss, and fatigue.  Skin:   Patient denies skin rash/ lesion and itching.  Eyes:   Patient denies blurred vision and double vision.  Ears/ Nose/ Throat:   Patient denies sore throat and sinus problems.  Hematologic/Lymphatic:   Patient denies swollen glands and easy bruising.  Cardiovascular:   Patient denies leg swelling and chest pains.  Respiratory:   Patient denies shortness of breath and cough.  Endocrine:   Patient denies excessive thirst.  Musculoskeletal:   Patient denies back pain and joint pain.  Neurological:   Patient denies headaches and dizziness.  Psychologic:   Patient denies depression and anxiety.   VITAL SIGNS:      07/21/2018 02:01 PM  Weight 120 lb / 54.43 kg  Height 69 in / 175.26 cm  BP 107/65 mmHg  Pulse 75 /min  Temperature 97.6 F / 36.4 C  BMI 17.7 kg/m   MULTI-SYSTEM PHYSICAL EXAMINATION:    Constitutional: Well-nourished. No physical deformities. Normally developed. Good grooming.  Neck: Neck symmetrical, not swollen. Normal tracheal position.  Respiratory: Normal breath sounds. No labored breathing, no use of accessory muscles.   Cardiovascular: Regular rate and rhythm. No murmur, no gallop.   Lymphatic: No enlargement, no tenderness of supraclavicular or neck  lymph nodes.  Skin: No paleness, no jaundice, no cyanosis. No lesion, no ulcer, no rash.  Neurologic / Psychiatric: Oriented to time, oriented to place, oriented to person. No depression, no anxiety, no agitation.  Gastrointestinal: No mass, no tenderness, no rigidity, non obese abdomen.  Musculoskeletal: Normal gait and station of head and neck.     PAST DATA REVIEWED:  Source Of History:  Patient  Lab Test Review:   BMP, CBC with Diff  Records Review:   Previous Hospital Records, Previous Patient Records  Urine Test Review:   Urinalysis  X-Ray Review: KUB: Reviewed Films. Discussed With Patient.  C.T. Stone Protocol: Reviewed Films. Discussed With Patient.    Notes:                     ER records reviewed.    PROCEDURES:         KUB - [id number]  A single view of the abdomen is obtained. There is a 67m left distal stone, a 425mright UVJ stone and a 67m10mLP stone all unchanged from CT. No bone, gas or soft tissue abnormalities are noted.                Urinalysis w/Scope Dipstick Dipstick Cont'd Micro  Color: Yellow Bilirubin: Neg mg/dL WBC/hpf: 0 - 5/hpf  Appearance: Clear Ketones: Neg mg/dL RBC/hpf: NS (Not Seen)  Specific Gravity: <=1.005 Blood: 2+ ery/uL Bacteria: NS (Not Seen)  pH: 6.0 Protein: Neg mg/dL Cystals: NS (Not Seen)  Glucose: Neg mg/dL Urobilinogen: 0.2 mg/dL Casts: NS (Not Seen)    Nitrites: Neg Trichomonas: Not Present    Leukocyte Esterase: Neg leu/uL Mucous: Not Present      Epithelial Cells: 0 - 5/hpf      Yeast: NS (Not Seen)      Sperm: Not Present    ASSESSMENT:      ICD-10 Details  1 GU:   Ureteral calculus - N20.1 Bilateral, 7mm667mft distal and 4mm 74mht UVJ stones with non-progression and a 67mm R27mstone. I discussed MET, Staged ESWL or URS which could potentially address all of the stones at one time. We are going to get him set up for URS later this week with attempt to remove both the ureteral and right renal stones. I have reviewed the risks of  ureteroscopy including bleeding, infection, ureteral injury, need for a stent or secondary procedures, thrombotic events and anesthetic complications. I have refilled his pain med.   2   Renal calculus - N20.0 Right, He will need a metabolic w/u after he recovers from the stone removal.    PLAN:            Medications New Meds: Hydrocodone-Acetaminophen 5 mg-325 mg tablet 1 tablet PO Q 6 H   #12  0 Refill(s)            Orders X-Rays: KUB          Schedule Return  Visit/Planned Activity: Next Available Appointment - Schedule Surgery          Document Letter(s):  Created for Patient: Clinical Summary

## 2018-07-24 ENCOUNTER — Ambulatory Visit (HOSPITAL_BASED_OUTPATIENT_CLINIC_OR_DEPARTMENT_OTHER): Payer: Managed Care, Other (non HMO) | Admitting: Anesthesiology

## 2018-07-24 ENCOUNTER — Ambulatory Visit (HOSPITAL_BASED_OUTPATIENT_CLINIC_OR_DEPARTMENT_OTHER)
Admission: RE | Admit: 2018-07-24 | Discharge: 2018-07-24 | Disposition: A | Discharge status: Home / Self Care | Payer: Managed Care, Other (non HMO) | Attending: Urology | Admitting: Urology

## 2018-07-24 ENCOUNTER — Encounter (HOSPITAL_BASED_OUTPATIENT_CLINIC_OR_DEPARTMENT_OTHER): Payer: Self-pay

## 2018-07-24 ENCOUNTER — Encounter (HOSPITAL_BASED_OUTPATIENT_CLINIC_OR_DEPARTMENT_OTHER)
Admission: RE | Disposition: A | Discharge status: Home / Self Care | Payer: Self-pay | Source: Home / Self Care | Attending: Urology

## 2018-07-24 ENCOUNTER — Other Ambulatory Visit: Payer: Self-pay

## 2018-07-24 DIAGNOSIS — N132 Hydronephrosis with renal and ureteral calculous obstruction: Secondary | ICD-10-CM | POA: Insufficient documentation

## 2018-07-24 DIAGNOSIS — Z87442 Personal history of urinary calculi: Secondary | ICD-10-CM | POA: Diagnosis not present

## 2018-07-24 DIAGNOSIS — Z87891 Personal history of nicotine dependence: Secondary | ICD-10-CM | POA: Diagnosis not present

## 2018-07-24 DIAGNOSIS — Z8049 Family history of malignant neoplasm of other genital organs: Secondary | ICD-10-CM | POA: Insufficient documentation

## 2018-07-24 DIAGNOSIS — N201 Calculus of ureter: Secondary | ICD-10-CM | POA: Diagnosis present

## 2018-07-24 DIAGNOSIS — Z841 Family history of disorders of kidney and ureter: Secondary | ICD-10-CM | POA: Diagnosis not present

## 2018-07-24 HISTORY — PX: CYSTOSCOPY/URETEROSCOPY/HOLMIUM LASER/STENT PLACEMENT: SHX6546

## 2018-07-24 HISTORY — DX: Calculus of ureter: N20.1

## 2018-07-24 HISTORY — DX: Personal history of urinary calculi: Z87.442

## 2018-07-24 HISTORY — DX: Calculus of kidney: N20.0

## 2018-07-24 HISTORY — DX: Urgency of urination: R39.15

## 2018-07-24 SURGERY — CYSTOSCOPY/URETEROSCOPY/HOLMIUM LASER/STENT PLACEMENT
Anesthesia: General | Laterality: Bilateral

## 2018-07-24 MED ORDER — ONDANSETRON HCL 4 MG/2ML IJ SOLN
INTRAMUSCULAR | Status: DC | PRN
  Administered 2018-07-24: 4 mg via INTRAVENOUS

## 2018-07-24 MED ORDER — ACETAMINOPHEN 325 MG PO TABS
650.0000 mg | ORAL_TABLET | ORAL | Status: DC | PRN
  Filled 2018-07-24: qty 2

## 2018-07-24 MED ORDER — PROPOFOL 10 MG/ML IV BOLUS
INTRAVENOUS | Status: DC | PRN
  Administered 2018-07-24: 150 mg via INTRAVENOUS

## 2018-07-24 MED ORDER — FENTANYL CITRATE (PF) 100 MCG/2ML IJ SOLN
25.0000 ug | INTRAMUSCULAR | Status: DC | PRN
  Filled 2018-07-24: qty 1

## 2018-07-24 MED ORDER — ACETAMINOPHEN 650 MG RE SUPP
650.0000 mg | RECTAL | Status: DC | PRN
  Filled 2018-07-24: qty 1

## 2018-07-24 MED ORDER — ACETAMINOPHEN 325 MG PO TABS
325.0000 mg | ORAL_TABLET | ORAL | Status: DC | PRN
  Filled 2018-07-24: qty 2

## 2018-07-24 MED ORDER — OXYCODONE HCL 5 MG PO TABS
5.0000 mg | ORAL_TABLET | Freq: Once | ORAL | Status: DC | PRN
  Filled 2018-07-24: qty 1

## 2018-07-24 MED ORDER — HYDROCODONE-ACETAMINOPHEN 5-325 MG PO TABS: 1.0000 | ORAL_TABLET | Freq: Four times a day (QID) | ORAL | 0 refills | Status: DC | PRN

## 2018-07-24 MED ORDER — MORPHINE SULFATE (PF) 2 MG/ML IV SOLN
2.0000 mg | INTRAVENOUS | Status: DC | PRN
  Filled 2018-07-24: qty 1

## 2018-07-24 MED ORDER — FENTANYL CITRATE (PF) 100 MCG/2ML IJ SOLN
INTRAMUSCULAR | Status: AC
  Filled 2018-07-24: qty 2

## 2018-07-24 MED ORDER — LIDOCAINE 2% (20 MG/ML) 5 ML SYRINGE
INTRAMUSCULAR | Status: AC
  Filled 2018-07-24: qty 5

## 2018-07-24 MED ORDER — DEXAMETHASONE SODIUM PHOSPHATE 10 MG/ML IJ SOLN
INTRAMUSCULAR | Status: DC | PRN
  Administered 2018-07-24: 10 mg via INTRAVENOUS

## 2018-07-24 MED ORDER — OXYCODONE HCL 5 MG PO TABS
ORAL_TABLET | ORAL | Status: AC
  Filled 2018-07-24: qty 1

## 2018-07-24 MED ORDER — LIDOCAINE 2% (20 MG/ML) 5 ML SYRINGE
INTRAMUSCULAR | Status: DC | PRN
  Administered 2018-07-24: 100 mg via INTRAVENOUS

## 2018-07-24 MED ORDER — MEPERIDINE HCL 25 MG/ML IJ SOLN
6.2500 mg | INTRAMUSCULAR | Status: DC | PRN
  Filled 2018-07-24: qty 1

## 2018-07-24 MED ORDER — PROPOFOL 10 MG/ML IV BOLUS
INTRAVENOUS | Status: AC
  Filled 2018-07-24: qty 20

## 2018-07-24 MED ORDER — MIDAZOLAM HCL 2 MG/2ML IJ SOLN
INTRAMUSCULAR | Status: AC
  Filled 2018-07-24: qty 2

## 2018-07-24 MED ORDER — MIDAZOLAM HCL 5 MG/5ML IJ SOLN
INTRAMUSCULAR | Status: DC | PRN
  Administered 2018-07-24: 2 mg via INTRAVENOUS

## 2018-07-24 MED ORDER — FENTANYL CITRATE (PF) 100 MCG/2ML IJ SOLN
INTRAMUSCULAR | Status: DC | PRN
  Administered 2018-07-24 (×2): 50 ug via INTRAVENOUS

## 2018-07-24 MED ORDER — SODIUM CHLORIDE 0.9 % IR SOLN
Status: DC | PRN
  Administered 2018-07-24 (×2): 3000 mL via INTRAVESICAL

## 2018-07-24 MED ORDER — ONDANSETRON HCL 4 MG/2ML IJ SOLN
4.0000 mg | Freq: Once | INTRAMUSCULAR | Status: DC | PRN
  Filled 2018-07-24: qty 2

## 2018-07-24 MED ORDER — ONDANSETRON HCL 4 MG/2ML IJ SOLN
INTRAMUSCULAR | Status: AC
  Filled 2018-07-24: qty 2

## 2018-07-24 MED ORDER — CEFAZOLIN SODIUM-DEXTROSE 2-4 GM/100ML-% IV SOLN
INTRAVENOUS | Status: AC
  Filled 2018-07-24: qty 100

## 2018-07-24 MED ORDER — OXYCODONE HCL 5 MG/5ML PO SOLN
5.0000 mg | Freq: Once | ORAL | Status: DC | PRN
  Filled 2018-07-24: qty 5

## 2018-07-24 MED ORDER — DEXAMETHASONE SODIUM PHOSPHATE 10 MG/ML IJ SOLN
INTRAMUSCULAR | Status: AC
  Filled 2018-07-24: qty 1

## 2018-07-24 MED ORDER — PHENAZOPYRIDINE HCL 200 MG PO TABS: 200.0000 mg | ORAL_TABLET | Freq: Three times a day (TID) | ORAL | 1 refills | Status: DC | PRN

## 2018-07-24 MED ORDER — KETOROLAC TROMETHAMINE 30 MG/ML IJ SOLN
INTRAMUSCULAR | Status: DC | PRN
  Administered 2018-07-24: 30 mg via INTRAVENOUS

## 2018-07-24 MED ORDER — KETOROLAC TROMETHAMINE 30 MG/ML IJ SOLN
INTRAMUSCULAR | Status: AC
  Filled 2018-07-24: qty 1

## 2018-07-24 MED ORDER — ACETAMINOPHEN 160 MG/5ML PO SOLN
325.0000 mg | ORAL | Status: DC | PRN
  Filled 2018-07-24: qty 20.3

## 2018-07-24 MED ORDER — OXYCODONE HCL 5 MG PO TABS
5.0000 mg | ORAL_TABLET | ORAL | Status: DC | PRN
  Administered 2018-07-24: 5 mg via ORAL
  Filled 2018-07-24: qty 2

## 2018-07-24 MED ORDER — CEFAZOLIN SODIUM-DEXTROSE 2-4 GM/100ML-% IV SOLN
2.0000 g | INTRAVENOUS | Status: DC
  Filled 2018-07-24: qty 100

## 2018-07-24 MED ORDER — LACTATED RINGERS IV SOLN
INTRAVENOUS | Status: DC
  Administered 2018-07-24: 09:00:00 via INTRAVENOUS
  Filled 2018-07-24: qty 1000

## 2018-07-24 MED ORDER — SODIUM CHLORIDE 0.9 % IV SOLN
250.0000 mL | INTRAVENOUS | Status: DC | PRN
  Filled 2018-07-24: qty 250

## 2018-07-24 MED ORDER — SODIUM CHLORIDE 0.9% FLUSH
3.0000 mL | INTRAVENOUS | Status: DC | PRN
  Filled 2018-07-24: qty 3

## 2018-07-24 MED ORDER — SODIUM CHLORIDE 0.9% FLUSH
3.0000 mL | Freq: Two times a day (BID) | INTRAVENOUS | Status: DC
  Filled 2018-07-24: qty 3

## 2018-07-24 SURGICAL SUPPLY — 32 items
BAG DRAIN URO-CYSTO SKYTR STRL (DRAIN) ×3 IMPLANT
BASKET STONE 1.7 NGAGE (UROLOGICAL SUPPLIES) ×3 IMPLANT
BASKET ZERO TIP NITINOL 2.4FR (BASKET) IMPLANT
CATH URET 5FR 28IN CONE TIP (BALLOONS)
CATH URET 5FR 28IN OPEN ENDED (CATHETERS) IMPLANT
CATH URET 5FR 70CM CONE TIP (BALLOONS) IMPLANT
CLOTH BEACON ORANGE TIMEOUT ST (SAFETY) ×3 IMPLANT
ELECT REM PT RETURN 9FT ADLT (ELECTROSURGICAL)
ELECTRODE REM PT RTRN 9FT ADLT (ELECTROSURGICAL) IMPLANT
FIBER LASER FLEXIVA 365 (UROLOGICAL SUPPLIES) IMPLANT
FIBER LASER TRAC TIP (UROLOGICAL SUPPLIES) ×3 IMPLANT
GLOVE BIO SURGEON STRL SZ 6.5 (GLOVE) ×2 IMPLANT
GLOVE BIO SURGEONS STRL SZ 6.5 (GLOVE) ×1
GLOVE BIOGEL PI IND STRL 6.5 (GLOVE) ×1 IMPLANT
GLOVE BIOGEL PI INDICATOR 6.5 (GLOVE) ×2
GLOVE SURG SS PI 8.0 STRL IVOR (GLOVE) ×3 IMPLANT
GOWN STRL REUS W/TWL LRG LVL3 (GOWN DISPOSABLE) ×3 IMPLANT
GOWN STRL REUS W/TWL XL LVL3 (GOWN DISPOSABLE) ×3 IMPLANT
GUIDEWIRE ANG ZIPWIRE 038X150 (WIRE) IMPLANT
GUIDEWIRE STR DUAL SENSOR (WIRE) ×3 IMPLANT
IV NS IRRIG 3000ML ARTHROMATIC (IV SOLUTION) ×3 IMPLANT
KIT BALLN UROMAX 15FX4 (MISCELLANEOUS) ×1 IMPLANT
KIT BALLN UROMAX 26 75X4 (MISCELLANEOUS) ×2
KIT TURNOVER CYSTO (KITS) ×3 IMPLANT
MANIFOLD NEPTUNE II (INSTRUMENTS) ×3 IMPLANT
NS IRRIG 500ML POUR BTL (IV SOLUTION) ×3 IMPLANT
PACK CYSTO (CUSTOM PROCEDURE TRAY) ×3 IMPLANT
SHEATH URETERAL 12FRX55CM (UROLOGICAL SUPPLIES) ×3 IMPLANT
STENT URET 6FRX26 CONTOUR (STENTS) ×3 IMPLANT
TUBE CONNECTING 12'X1/4 (SUCTIONS) ×1
TUBE CONNECTING 12X1/4 (SUCTIONS) ×2 IMPLANT
TUBING UROLOGY SET (TUBING) IMPLANT

## 2018-07-24 NOTE — Anesthesia Postprocedure Evaluation (Signed)
Anesthesia Post Note  Patient: Elpidio Ericric J Diantonio  Procedure(s) Performed: CYSTOSCOPY BILATERAL RETEROGRADE BILATERAL URETEROSCOPY HOLMIUM LASER  STENT PLACEMENT, (Bilateral )     Patient location during evaluation: PACU Anesthesia Type: General Level of consciousness: awake and alert Pain management: pain level controlled Vital Signs Assessment: post-procedure vital signs reviewed and stable Respiratory status: spontaneous breathing, nonlabored ventilation, respiratory function stable and patient connected to nasal cannula oxygen Cardiovascular status: blood pressure returned to baseline and stable Postop Assessment: no apparent nausea or vomiting Anesthetic complications: no    Last Vitals:  Vitals:   07/24/18 0945 07/24/18 1000  BP: 123/86 125/80  Pulse: (!) 50 (!) 52  Resp: 12 11  Temp:    SpO2: 99% 96%    Last Pain:  Vitals:   07/24/18 1000  TempSrc:   PainSc: 0-No pain                 Atticus Lemberger

## 2018-07-24 NOTE — Discharge Instructions (Signed)
Ureteral Stent Implantation, Care After °Refer to this sheet in the next few weeks. These instructions provide you with information about caring for yourself after your procedure. Your health care provider may also give you more specific instructions. Your treatment has been planned according to current medical practices, but problems sometimes occur. Call your health care provider if you have any problems or questions after your procedure. °What can I expect after the procedure? °After the procedure, it is common to have: °· Nausea. °· Mild pain when you urinate. You may feel this pain in your lower back or lower abdomen. Pain should stop within a few minutes after you urinate. This may last for up to 1 week. °· A small amount of blood in your urine for several days. °Follow these instructions at home: ° °Medicines °· Take over-the-counter and prescription medicines only as told by your health care provider. °· If you were prescribed an antibiotic medicine, take it as told by your health care provider. Do not stop taking the antibiotic even if you start to feel better. °· Do not drive for 24 hours if you received a sedative. °· Do not drive or operate heavy machinery while taking prescription pain medicines. °Activity °· Return to your normal activities as told by your health care provider. Ask your health care provider what activities are safe for you. °· Do not lift anything that is heavier than 10 lb (4.5 kg). Follow this limit for 1 week after your procedure, or for as long as told by your health care provider. °General instructions °· Watch for any blood in your urine. Call your health care provider if the amount of blood in your urine increases. °· If you have a catheter: °? Follow instructions from your health care provider about taking care of your catheter and collection bag. °? Do not take baths, swim, or use a hot tub until your health care provider approves. °· Drink enough fluid to keep your urine  clear or pale yellow. °· Keep all follow-up visits as told by your health care provider. This is important. °Contact a health care provider if: °· You have pain that gets worse or does not get better with medicine, especially pain when you urinate. °· You have difficulty urinating. °· You feel nauseous or you vomit repeatedly during a period of more than 2 days after the procedure. °Get help right away if: °· Your urine is dark red or has blood clots in it. °· You are leaking urine (have incontinence). °· The end of the stent comes out of your urethra. °· You cannot urinate. °· You have sudden, sharp, or severe pain in your abdomen or lower back. °· You have a fever. °This information is not intended to replace advice given to you by your health care provider. Make sure you discuss any questions you have with your health care provider. °Document Released: 02/04/2013 Document Revised: 11/10/2015 Document Reviewed: 12/17/2014 °Elsevier Interactive Patient Education © 2019 [employer] ° °Post Anesthesia Home Care Instructions ° °Activity: °Get plenty of rest for the remainder of the day. A responsible individual must stay with you for 24 hours following the procedure.  °For the next 24 hours, DO NOT: °-Drive a car °-Operate machinery °-Drink alcoholic beverages °-Take any medication unless instructed by your physician °-Make any legal decisions or sign important papers. ° °Meals: °Start with liquid foods such as gelatin or soup. Progress to regular foods as tolerated. Avoid greasy, spicy, heavy foods. If nausea and/or vomiting   occur, drink only clear liquids until the nausea and/or vomiting subsides. Call your physician if vomiting continues. ° °Special Instructions/Symptoms: °Your throat may feel dry or sore from the anesthesia or the breathing tube placed in your throat during surgery. If this causes discomfort, gargle with warm salt water. The discomfort should disappear within 24 hours. ° °If you had a  scopolamine patch placed behind your ear for the management of post- operative nausea and/or vomiting: ° °1. The medication in the patch is effective for 72 hours, after which it should be removed.  Wrap patch in a tissue and discard in the trash. Wash hands thoroughly with soap and water. °2. You may remove the patch earlier than 72 hours if you experience unpleasant side effects which may include dry mouth, dizziness or visual disturbances. °3. Avoid touching the patch. Wash your hands with soap and water after contact with the patch. °   ° °

## 2018-07-24 NOTE — Anesthesia Preprocedure Evaluation (Addendum)
Anesthesia Evaluation  Patient identified by MRN, date of birth, ID band Patient awake    Reviewed: Allergy & Precautions, H&P , NPO status , Patient's Chart, lab work & pertinent test results, reviewed documented beta blocker date and time   Airway Mallampati: I  TM Distance: >3 FB Neck ROM: full    Dental no notable dental hx. (+) Dental Advisory Given, Teeth Intact   Pulmonary neg pulmonary ROS, former smoker,    Pulmonary exam normal breath sounds clear to auscultation       Cardiovascular Exercise Tolerance: Good negative cardio ROS   Rhythm:regular Rate:Normal     Neuro/Psych negative neurological ROS  negative psych ROS   GI/Hepatic negative GI ROS, Neg liver ROS,   Endo/Other  negative endocrine ROS  Renal/GU negative Renal ROS  negative genitourinary   Musculoskeletal   Abdominal   Peds  Hematology negative hematology ROS (+)   Anesthesia Other Findings   Reproductive/Obstetrics negative OB ROS                            Anesthesia Physical Anesthesia Plan  ASA: I  Anesthesia Plan: General   Post-op Pain Management:    Induction: Intravenous  PONV Risk Score and Plan: 2 and Ondansetron, Dexamethasone and Treatment may vary due to age or medical condition  Airway Management Planned: LMA  Additional Equipment:   Intra-op Plan:   Post-operative Plan: Extubation in OR  Informed Consent: I have reviewed the patients History and Physical, chart, labs and discussed the procedure including the risks, benefits and alternatives for the proposed anesthesia with the patient or authorized representative who has indicated his/her understanding and acceptance.     Dental Advisory Given  Plan Discussed with: CRNA, Anesthesiologist and Surgeon  Anesthesia Plan Comments: ( )        Anesthesia Quick Evaluation

## 2018-07-24 NOTE — Transfer of Care (Signed)
Immediate Anesthesia Transfer of Care Note  Patient: Edgar Campbell  Procedure(s) Performed: CYSTOSCOPY BILATERAL RETEROGRADE BILATERAL URETEROSCOPY HOLMIUM LASER  STENT PLACEMENT, (Bilateral )  Patient Location: PACU  Anesthesia Type:General  Level of Consciousness: awake, alert  and oriented  Airway & Oxygen Therapy: Patient Spontanous Breathing and Patient connected to nasal cannula oxygen  Post-op Assessment: Report given to RN  Post vital signs: Reviewed and stable  Last Vitals: 152/92, 53, 10, 100%, 97.7 Vitals Value Taken Time  BP    Temp    Pulse 58 07/24/2018  9:33 AM  Resp    SpO2 100 % 07/24/2018  9:33 AM  Vitals shown include unvalidated device data.  Last Pain:  Vitals:   07/24/18 0704  TempSrc:   PainSc: 1          Complications: No apparent anesthesia complications

## 2018-07-24 NOTE — Op Note (Addendum)
Procedure: 1.  Cystoscopy with bilateral retrograde pyelograms and interpretation. 2.  Left ureteroscopic stone extraction. 3. Right ureteroscopy with holmium laser application and insertion of right double-J stent.  Preop diagnosis: Bilateral distal ureteral stones and right lower pole stone.  Postop diagnosis: left distal ureteral stone and right lower pole stone with interval passage of right distal ureteral stone.  Surgeon: Dr. Irine Seal.  Anesthesia: General.  Specimen: Stone.  Drain: Right 6 [place] by 26 cm contour double-J stent without tether.  EBL: Minimal.  Complications: None.  Indications: Edgar Campbell is a 33 year old male with a history of urolithiasis who was recently seen in the office with bilateral distal ureteral stones, 4 mm on the right and 7 mm on the left, and a right lower pole stone.  He is elected to undergo ureteroscopic management of stones.  Procedure: He was taken operating room where a general anesthetic was induced.  He been given Ancef 2 g.  He was placed in lithotomy position and fitted with PAS hose.  His perineum and genitalia were prepped with Betadine solution he was draped in usual sterile fashion.  Cystoscopy was performed using a 23 [place] scope and a 30 degree lens.  Examination revealed a normal urethra.  The prostate was short without obstruction.  Examination of bladder revealed a smooth wall without tumors or stones.  The right ureteral orifice was in the normal anatomic position with some peri-meatal hemorrhagic changes consistent with probable recent stone passage.  The left ureteral orifice was unremarkable.  Bilateral retrograde pyelography was performed with a 5 French opening catheter and Omnipaque.  Left retrograde pyelogram revealed a normal intramural ureter with a filling defect approximately 3 cm above the meatus consistent with his stone which was apparent on on fluoroscopy prior to the injection.  Right retrograde pyelogram confirmed  passage of the 4 mm distal ureteral stone.  The ureter had normal caliber but some mild tortuosity without filling defects.  Intrarenal collecting system was mildly full to the UPJ with a filling defect in the lower pole consistent with his known stone.  A guidewire was passed up the left ureteral orifice to the kidney.  A 4 cm x 15 French high-pressure balloon was placed over the wire and the intramural ureter was dilated to 20 atm of pressure under fluoroscopic guidance.  The cystoscope and balloon were removed and a 4.5 French semirigid ureteroscope was passed without difficulty up to the stone which was grasped with an engage basket and removed intact.  It was not felt that stenting was indicated.  So the guidewire was removed.  An attempt was made to pass the 4-1/2 [place] ureteroscope up the right ureteral orifice but it was initially unsuccessful because of some angulation at the meatus.  A guidewire was passed through the ureteroscope and advanced to the mid ureter.  Ureteroscope was removed and a 5 [place] opening catheter was passed over the guidewire to aid advancement to the kidney.  A 55 cm digital access sheath inner core was passed over the wire and easily advanced to the kidney.  The assembled sheath was then passed but was not able to go beyond the proximal ureter.  The inner core and wire were removed.  The dual-lumen digital ureteroscope was inserted through the sheath and that the tip of the sheath there was noted to be a mucosal split but I was able to negotiate by this into the collecting system.  Because of the mucosal injury it was felt that I would  be unlikely to make multiple passes to retrieve the stone fragments so the decision was made to dust the stone.  Inspection of the collecting system demonstrated the stone in the lower pole as expected.  The 200 m tract tip laser fiber was passed and the stone was engaged with the settings initially on 0.8 J and 10 Hz.  The stone  fragmented readily.  The settings were changed to 0.5 J and 53 Hz and the stone was reduced to dust.  Inspection of the intrarenal collecting system after completion of fragmentation demonstrated no fragments larger than 1 to 2 mm in the collecting system.  A guidewire was then advanced through the ureteroscope to the kidney and the ureteroscope was removed along with the sheath while visually inspecting the ureter.  There was approximately a 3 cm linear mucosal tear as noted on initial ureteroscopy, but the remainder of the ureter was intact.  The cystoscope was inserted over the wire and a 6 [place] by 26 cm contour double-J stent was advanced to the kidney under fluoroscopic guidance.  The wire was removed leaving a good coil in the kidney and a good coil in the bladder.  The bladder was drained and the cystoscope was removed.  He was taken down from lithotomy position, his anesthetic was reversed and he was moved recovery in stable condition.  There were no complications.

## 2018-07-24 NOTE — Interval H&P Note (Signed)
History and Physical Interval Note:  07/24/2018 8:34 AM  Edgar EricEric J Hove  has presented today for surgery, with the diagnosis of BILATERAL DISTAL URETERAL AND RIGHT RENAL STONES  The various methods of treatment have been discussed with the patient and family. After consideration of risks, benefits and other options for treatment, the patient has consented to  Procedure(s): CYSTOSCOPY BILATERAL RETEROGRADE BILATERAL URETEROSCOPY HOLMIUM LASER POSSIBLE STENT PLACEMENT (Bilateral) as a surgical intervention .  The patient's history has been reviewed, patient examined, no change in status, stable for surgery.  I have reviewed the patient's chart and labs.  Questions were answered to the patient's satisfaction.     Bjorn PippinJohn Aayan Haskew

## 2018-07-24 NOTE — Anesthesia Procedure Notes (Addendum)
Procedure Name: LMA Insertion Date/Time: 07/24/2018 8:41 AM Performed by: Briant Sites, CRNA Pre-anesthesia Checklist: Patient identified, Emergency Drugs available, Suction available and Patient being monitored Patient Re-evaluated:Patient Re-evaluated prior to induction Oxygen Delivery Method: Circle system utilized Preoxygenation: Pre-oxygenation with 100% oxygen Induction Type: IV induction Ventilation: Mask ventilation without difficulty LMA: LMA inserted LMA Size: 4.0 Number of attempts: 1 Airway Equipment and Method: Bite block Placement Confirmation: positive ETCO2 Tube secured with: Tape Dental Injury: Teeth and Oropharynx as per pre-operative assessment

## 2018-07-25 ENCOUNTER — Encounter (HOSPITAL_BASED_OUTPATIENT_CLINIC_OR_DEPARTMENT_OTHER): Payer: Self-pay | Admitting: Urology

## 2019-07-31 IMAGING — CT CT RENAL STONE PROTOCOL
2 of 4 series · 16 of 46 positions shown, 18 images · non-contrast
Comparison: 07/04/2011.

CLINICAL DATA: Lower abdominal pain and nausea. History of kidney
stones.

EXAM:
CT ABDOMEN AND PELVIS WITHOUT CONTRAST
TECHNIQUE: Multidetector CT imaging of the abdomen and pelvis was performed
following the standard protocol without IV contrast.

[Series 3: renal stone 5.0 · axial · 0.69mm/px · z∈[+855,+1265]mm · 13 of 90 slices shown, 15 images]
[im 4/90  soft-tissue]
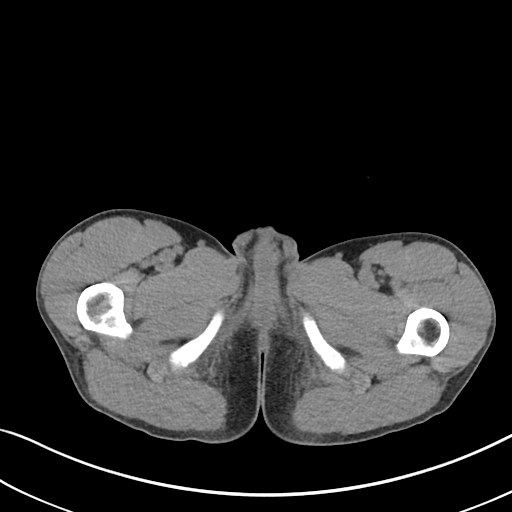
[im 4/90  bone]
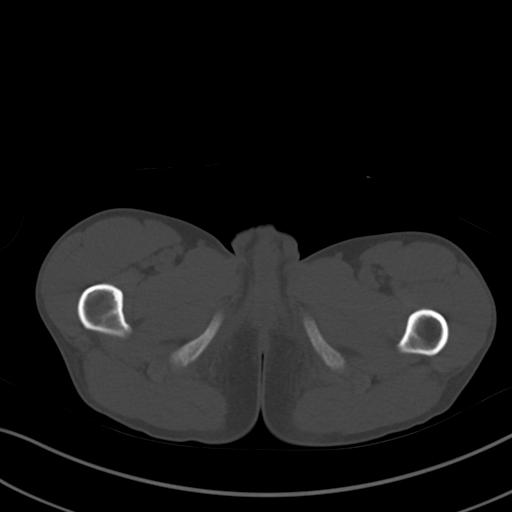
[im 12/90  soft-tissue]
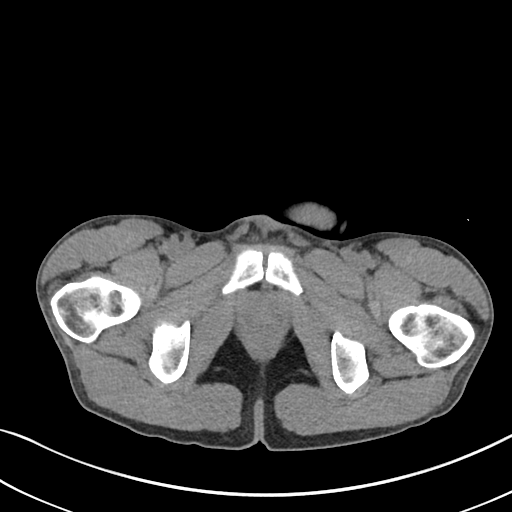
[im 19/90  soft-tissue]
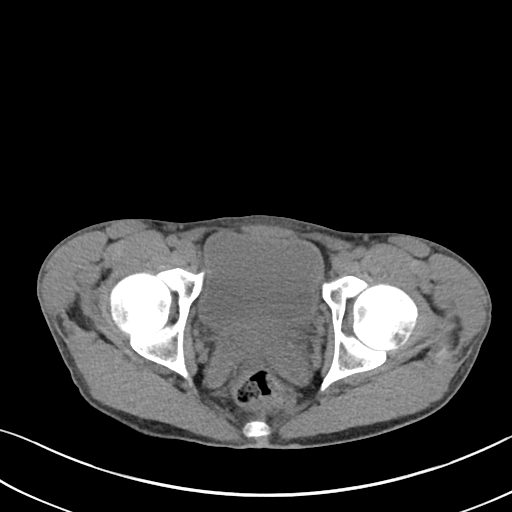
[im 26/90  soft-tissue]
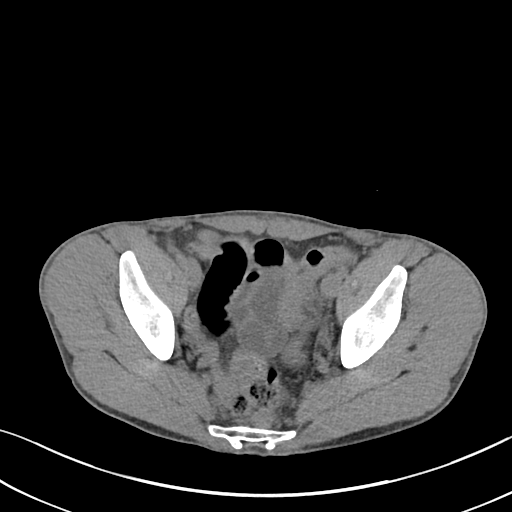
[im 30/90  soft-tissue]
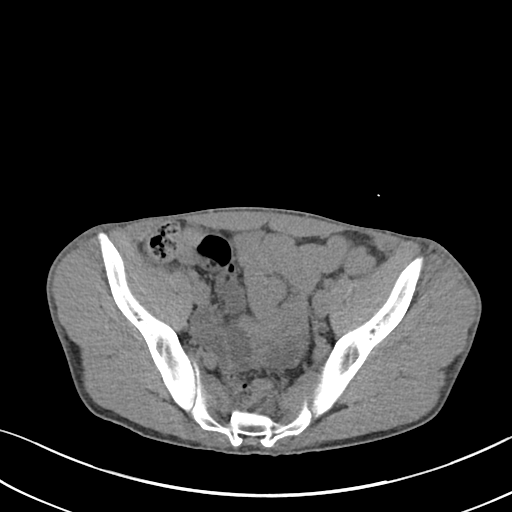
[im 38/90  soft-tissue]
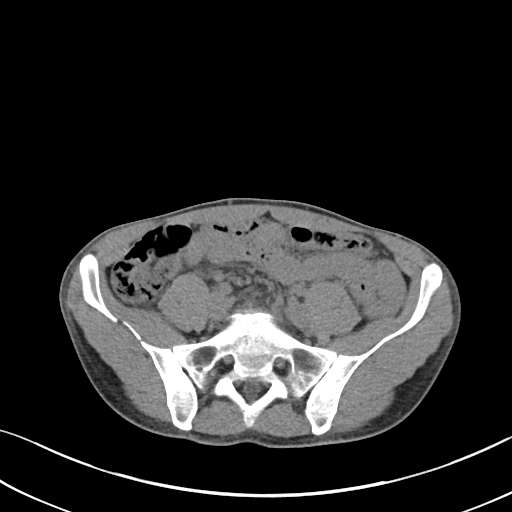
[im 45/90  soft-tissue]
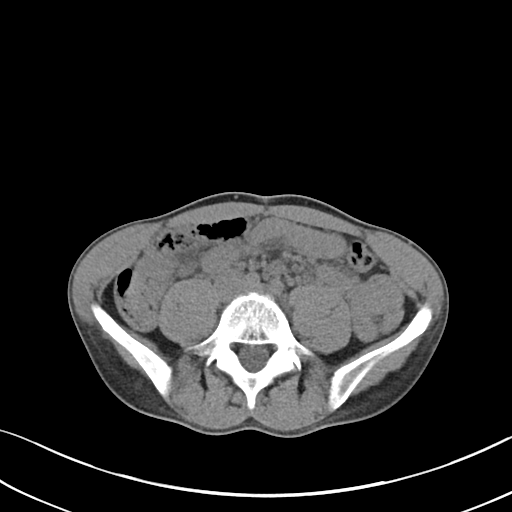
[im 52/90  soft-tissue]
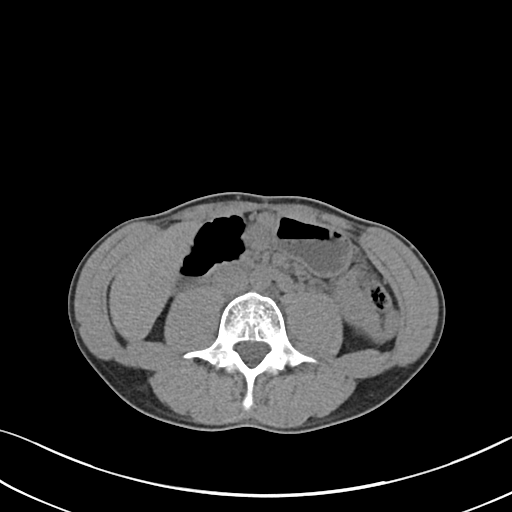
[im 60/90  soft-tissue]
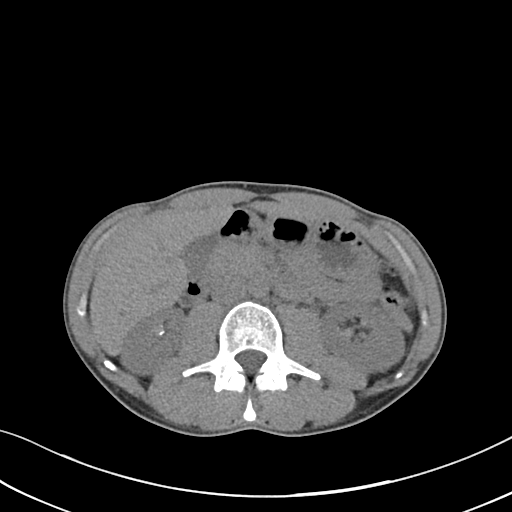
[im 60/90  bone]
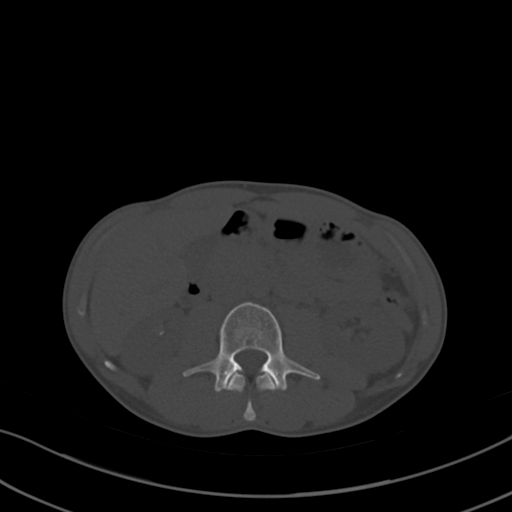
[im 64/90  soft-tissue]
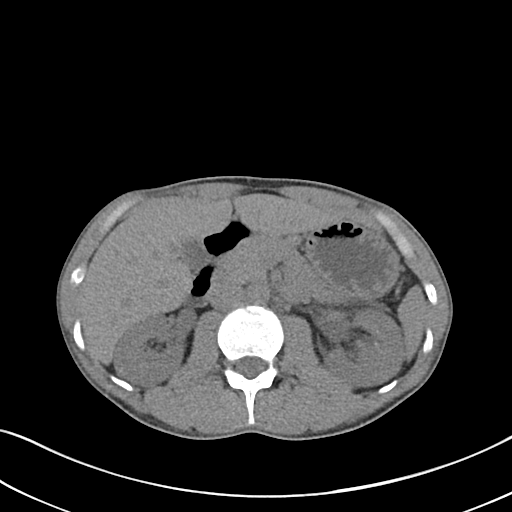
[im 71/90  soft-tissue]
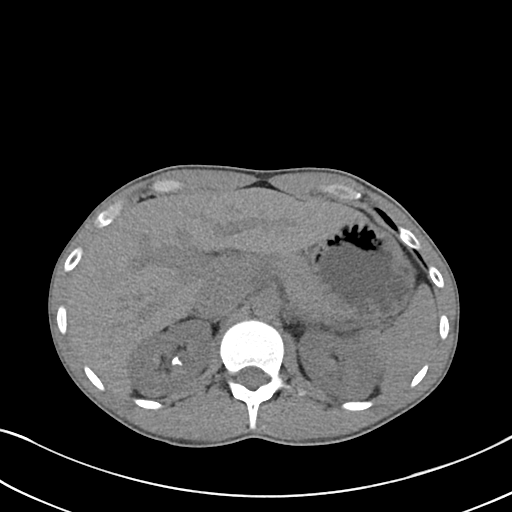
[im 78/90  soft-tissue]
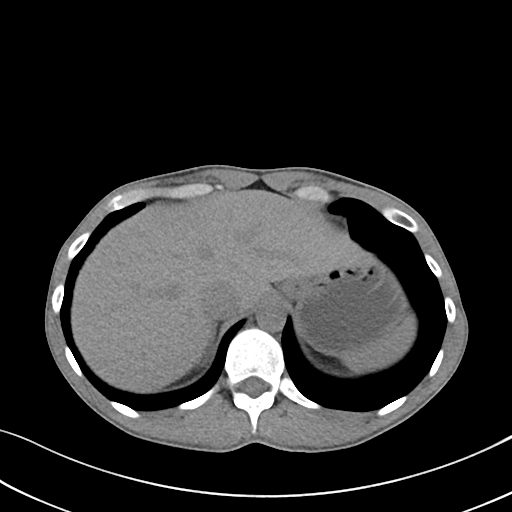
[im 86/90  soft-tissue]
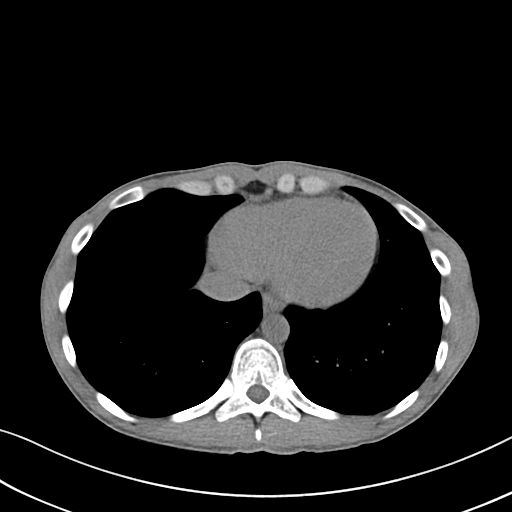

[Series 5: renal stone 3.0 cor · coronal · 0.63mm/px · 3 of 76 slices shown]
[im 26/76  soft-tissue]
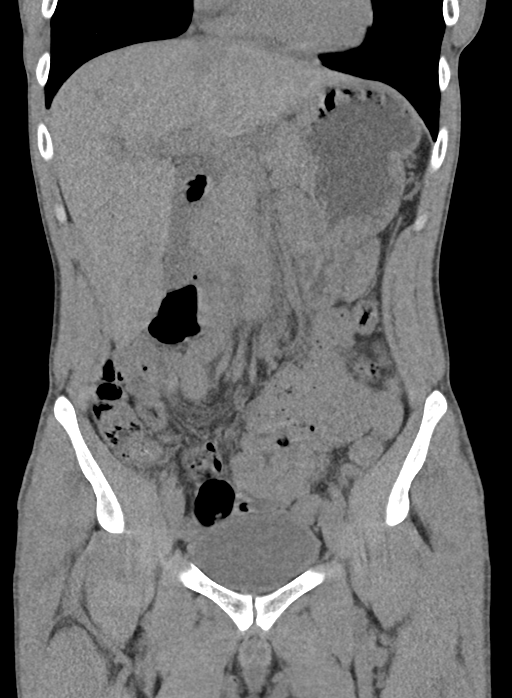
[im 34/76  soft-tissue]
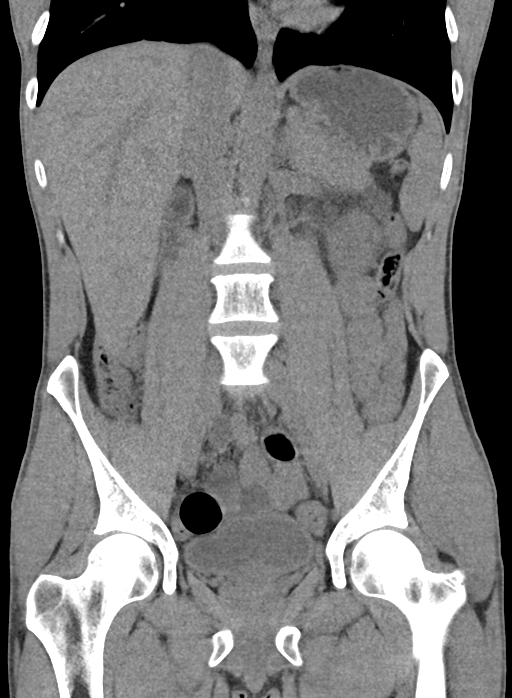
[im 42/76  soft-tissue]
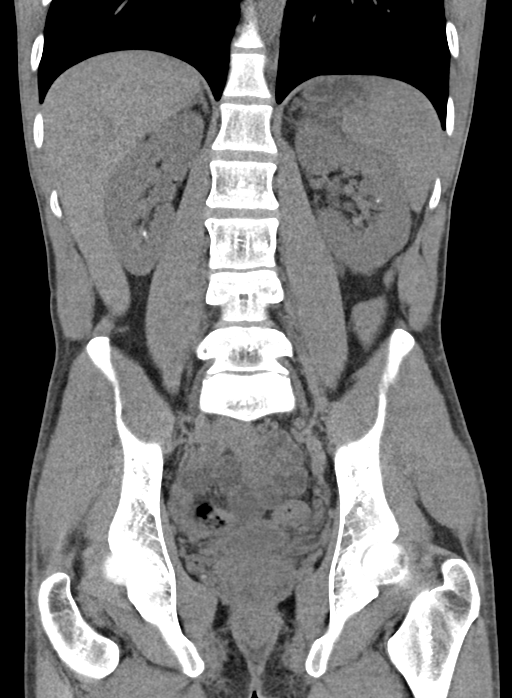

[16 of 46 positions shown; findings below may reference images not displayed]

FINDINGS: Lower chest: Negative.

Hepatobiliary: Liver measures 19.6 cm. Liver and gallbladder are
otherwise unremarkable. No biliary ductal dilatation.

Pancreas: Negative.

Spleen: Negative.

Adrenals/Urinary Tract: Adrenal glands are unremarkable. Stones are
seen in the kidneys bilaterally. Right ureter is decompressed. Left
renal edema and mild residual left hydronephrosis secondary to
passage of a 2 mm stone into the bladder. Bladder is otherwise
grossly unremarkable.

Stomach/Bowel: Stomach, small bowel, appendix and colon are
unremarkable.

Vascular/Lymphatic: Vascular structures are unremarkable. No
pathologically enlarged lymph nodes.

Reproductive: Prostate is normal in size.

Other: No free fluid.  Mesenteries and peritoneum are unremarkable.

Musculoskeletal: Negative.
IMPRESSION: 1. Left renal edema and mild residual left hydronephrosis secondary
to passage of a 2 mm stone into the bladder.
2. Bilateral renal stones.
3. Hepatomegaly.

## 2020-07-23 IMAGING — CT CT RENAL STONE PROTOCOL
2 of 4 series · 16 of 46 positions shown, 18 images · non-contrast
Comparison: CT 07/24/2017

CLINICAL DATA: Right-sided flank pain

EXAM:
CT ABDOMEN AND PELVIS WITHOUT CONTRAST
TECHNIQUE: Multidetector CT imaging of the abdomen and pelvis was performed
following the standard protocol without IV contrast.

[Series 3: ap without · axial · non-contrast · 0.65mm/px · z∈[+941,+1321]mm · 13 of 85 slices shown, 15 images]
[im 5/85  soft-tissue]
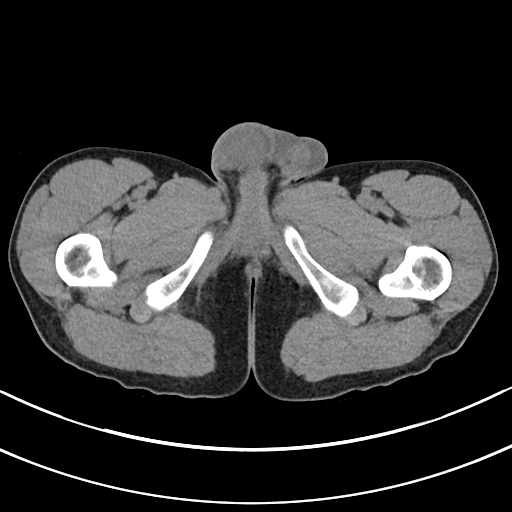
[im 5/85  bone]
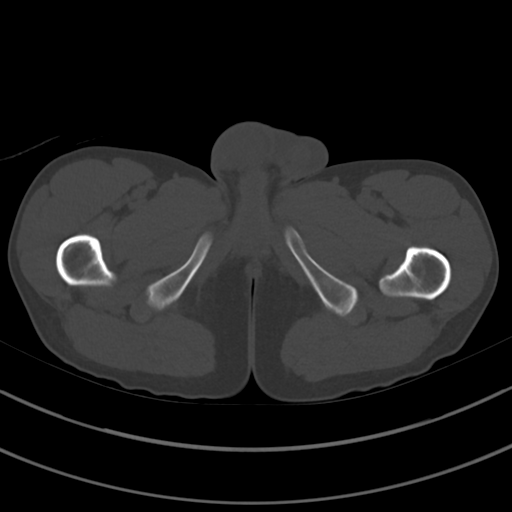
[im 13/85  soft-tissue]
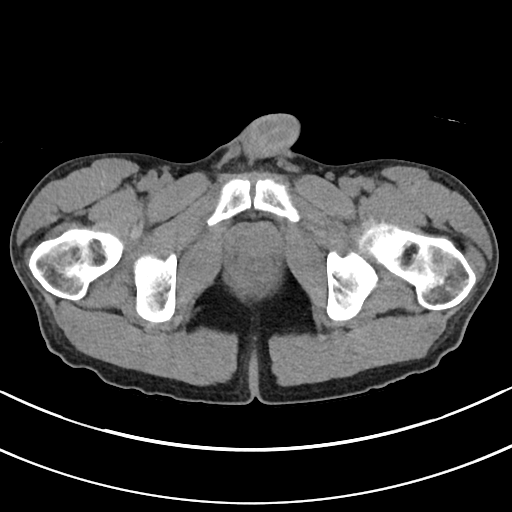
[im 17/85  soft-tissue]
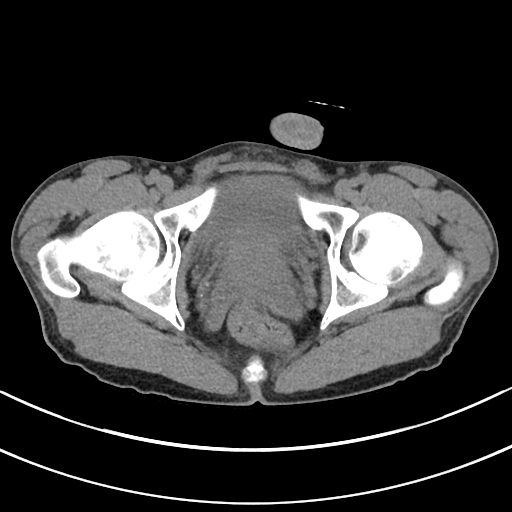
[im 25/85  soft-tissue]
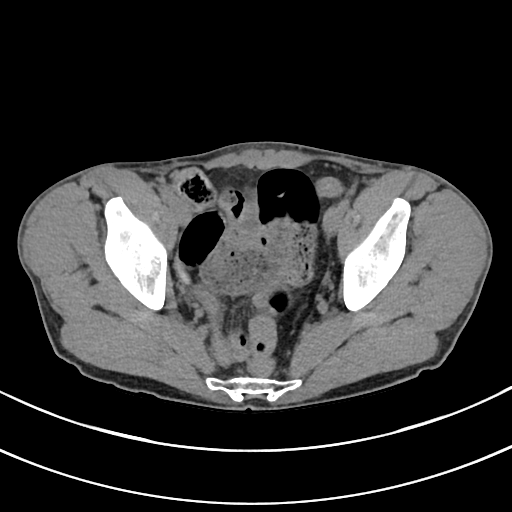
[im 29/85  soft-tissue]
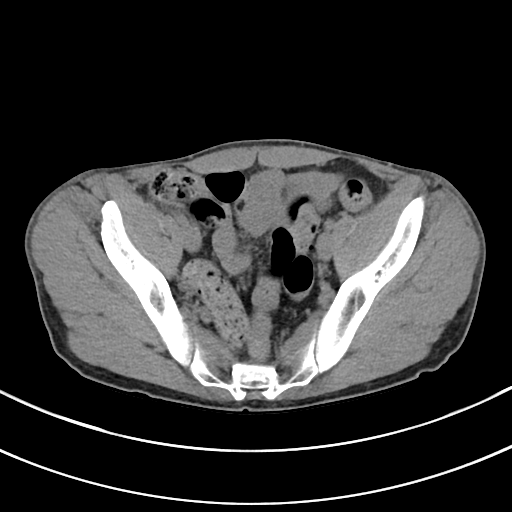
[im 37/85  soft-tissue]
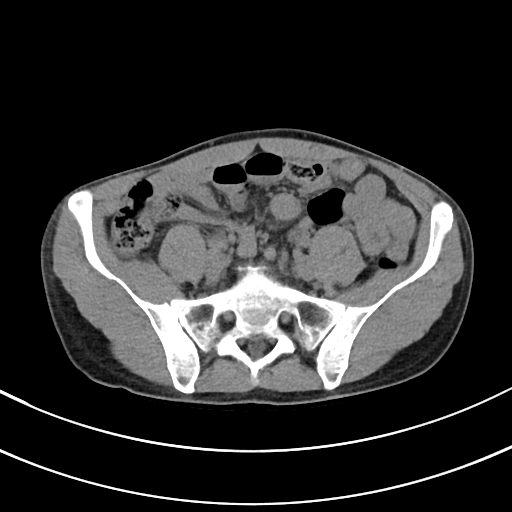
[im 45/85  soft-tissue]
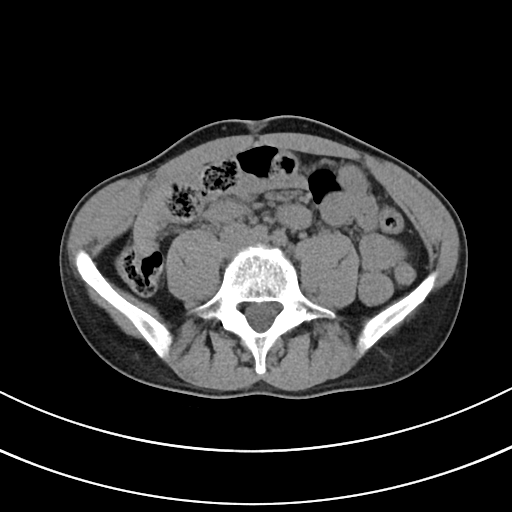
[im 49/85  soft-tissue]
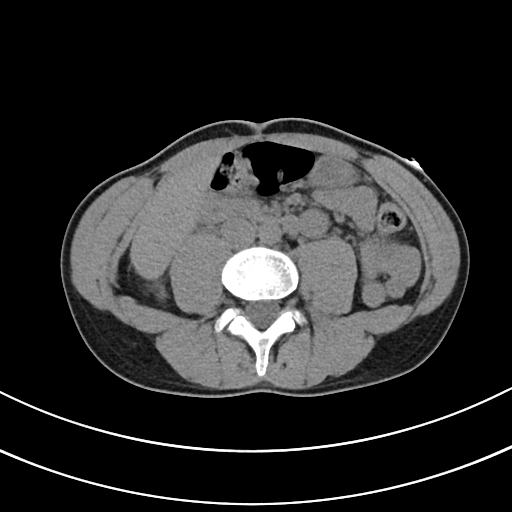
[im 57/85  soft-tissue]
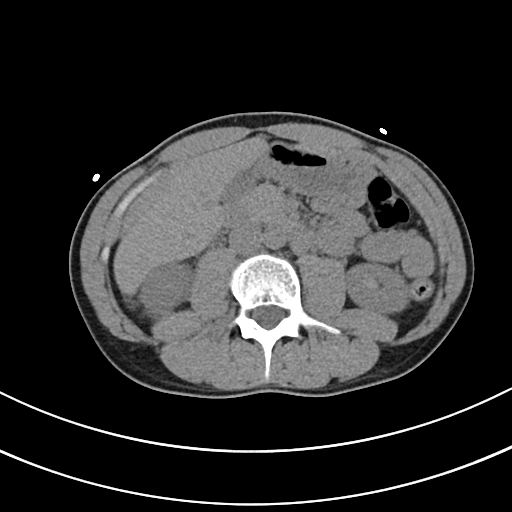
[im 57/85  bone]
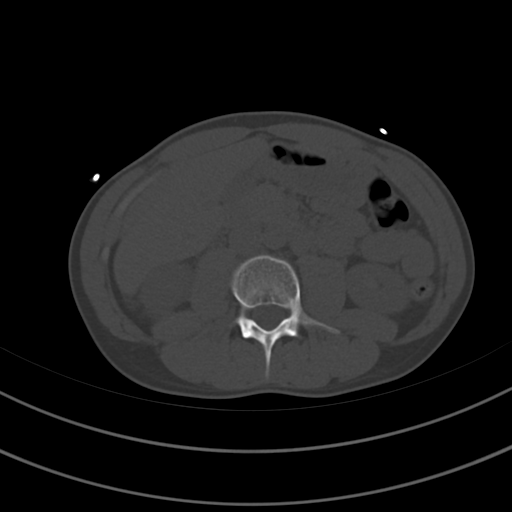
[im 61/85  soft-tissue]
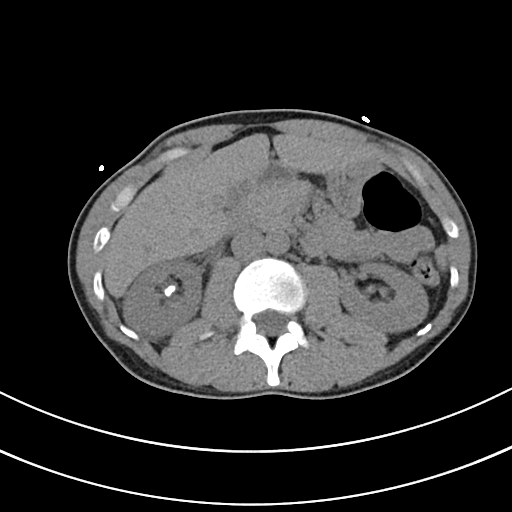
[im 69/85  soft-tissue]
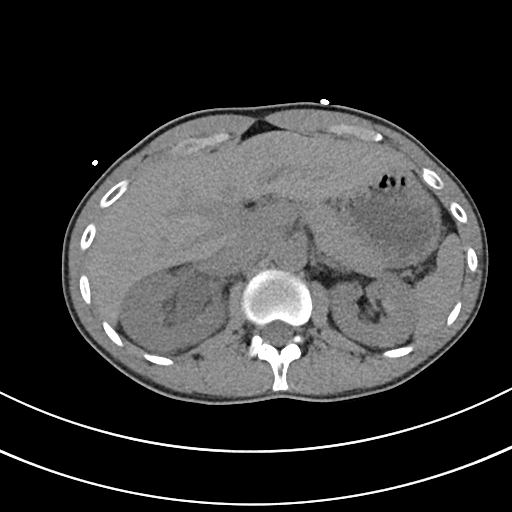
[im 73/85  soft-tissue]
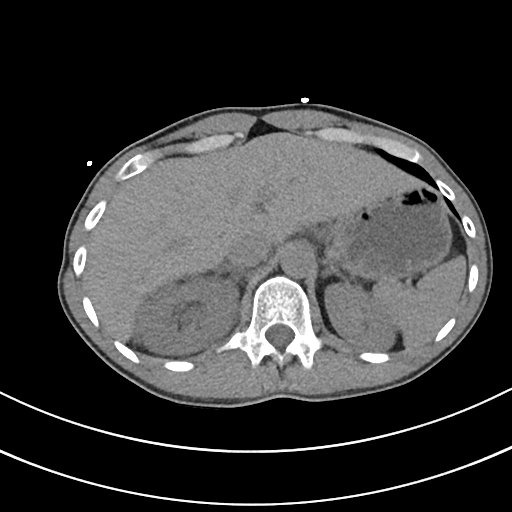
[im 81/85  soft-tissue]
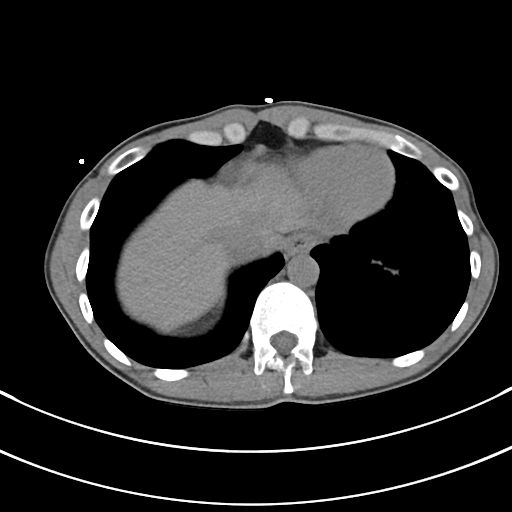

[Series 6: cor · coronal · 0.64mm/px · 3 of 74 slices shown]
[im 25/74  soft-tissue]
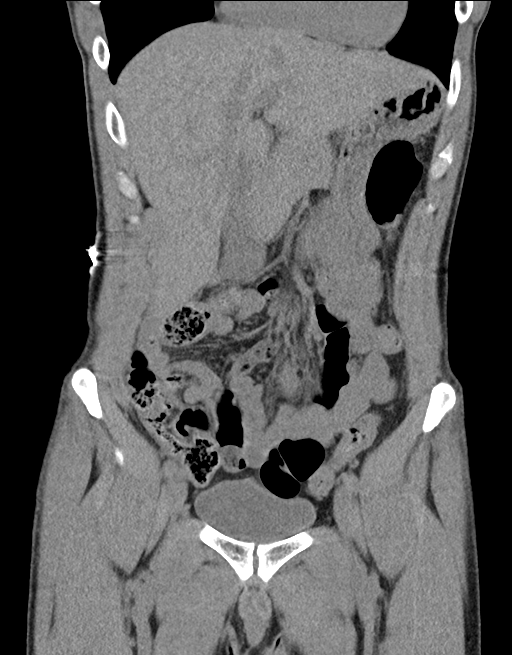
[im 33/74  soft-tissue]
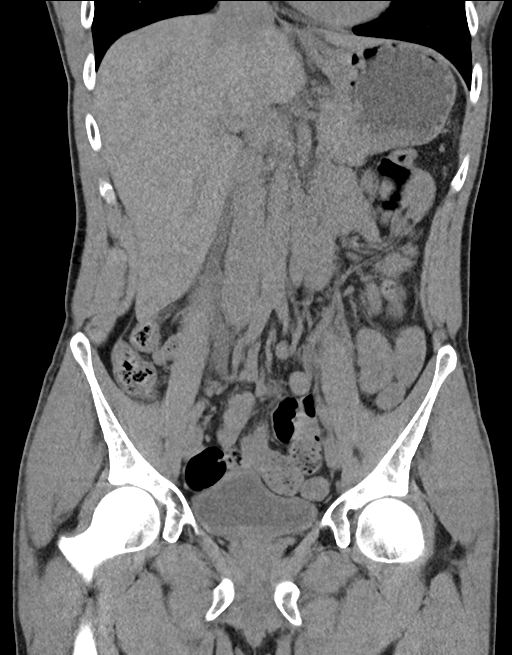
[im 41/74  soft-tissue]
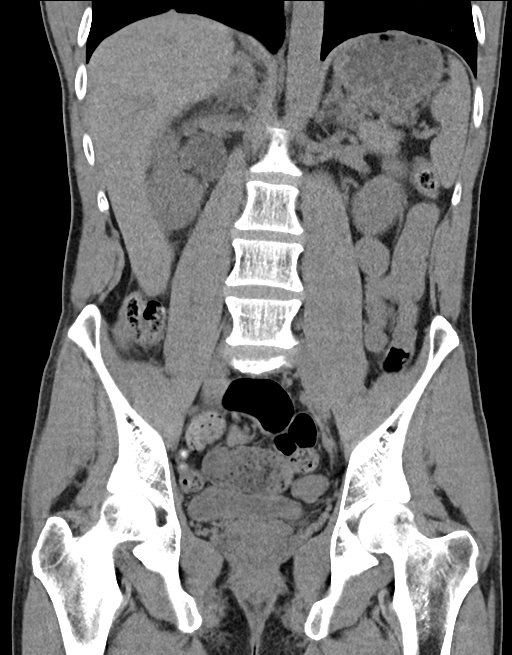

[16 of 46 positions shown; findings below may reference images not displayed]

FINDINGS: Lower chest: Lung bases demonstrate no acute consolidation or
effusion. The heart size is normal

Hepatobiliary: No focal liver abnormality is seen. No gallstones,
gallbladder wall thickening, or biliary dilatation.

Pancreas: Unremarkable. No pancreatic ductal dilatation or
surrounding inflammatory changes.

Spleen: Normal in size without focal abnormality.

Adrenals/Urinary Tract: Adrenal glands are normal. Intrarenal stones
within both kidneys. On the right, lower pole stone measures up to
11 mm. On the left, lower pole stone measures up to 3 mm. Moderate
right hydronephrosis and hydroureter, secondary to a 4 mm stone at
the right UVJ. 7 mm oblong calcification in the left posterior
pelvis in the vicinity of distal ureter, possibly also reflecting
stone although no significant obstruction is noted upstream to this.

Stomach/Bowel: Stomach is within normal limits. No evidence of bowel
wall thickening, distention, or inflammatory changes.

Vascular/Lymphatic: No significant vascular findings are present. No
enlarged abdominal or pelvic lymph nodes.

Reproductive: Prostate is unremarkable.

Other: Negative for free air or free fluid

Musculoskeletal: Limbus vertebra at L4. No acute or suspicious
abnormality
IMPRESSION: 1. Moderate right hydronephrosis and hydroureter, secondary to a 4
mm stone at the right UVJ
2. 7 mm oblong calcification in the left pelvis, appears to localize
to the distal left ureter about a cm proximal to the left UVJ
however no significant hydronephrosis or hydroureter associated with
this finding.
3. Intrarenal stones bilaterally

## 2022-03-06 ENCOUNTER — Other Ambulatory Visit (HOSPITAL_COMMUNITY): Payer: Self-pay | Admitting: Urology

## 2022-03-06 ENCOUNTER — Other Ambulatory Visit: Payer: Self-pay | Admitting: Urology

## 2022-03-06 ENCOUNTER — Ambulatory Visit (HOSPITAL_COMMUNITY): Payer: Self-pay

## 2022-03-06 DIAGNOSIS — N200 Calculus of kidney: Secondary | ICD-10-CM

## 2022-03-07 ENCOUNTER — Ambulatory Visit
Admission: RE | Admit: 2022-03-07 | Discharge: 2022-03-07 | Disposition: A | Discharge status: Home / Self Care | Payer: No Typology Code available for payment source | Source: Ambulatory Visit | Attending: Urology | Admitting: Urology

## 2022-03-07 DIAGNOSIS — N200 Calculus of kidney: Secondary | ICD-10-CM

## 2022-03-13 ENCOUNTER — Other Ambulatory Visit: Payer: Self-pay | Admitting: Urology

## 2022-03-13 ENCOUNTER — Encounter (HOSPITAL_BASED_OUTPATIENT_CLINIC_OR_DEPARTMENT_OTHER): Payer: Self-pay | Admitting: Urology

## 2022-03-14 ENCOUNTER — Other Ambulatory Visit: Payer: Self-pay

## 2022-03-14 ENCOUNTER — Encounter (HOSPITAL_BASED_OUTPATIENT_CLINIC_OR_DEPARTMENT_OTHER): Payer: Self-pay | Admitting: Urology

## 2022-03-14 NOTE — Progress Notes (Signed)
Spoke w/ via phone for pre-op interview---pt Lab needs dos---- none              Lab results------ COVID test -----patient states asymptomatic no test needed Arrive at -------830 am 03-26-2022 NPO after MN NO Solid Food.  Clear liquids from MN until---730 am Med rec completed Medications to take morning of surgery -----Tamsulosin, Oxycodoone prn Diabetic medication -----n/a Patient instructed no nail polish to be worn day of surgery Patient instructed to bring photo id and insurance card day of surgery Patient aware to have Driver (ride ) / caregiver   wife brittany Colvard  for 24 hours after surgery  Patient Special Instructions -----none Pre-Op special Istructions -----none Patient verbalized understanding of instructions that were given at this phone interview. Patient denies shortness of breath, chest pain, fever, cough at this phone interview.   Ok per Rolla Flatten to bring in 36 year old child to waiting room.

## 2022-03-26 ENCOUNTER — Ambulatory Visit (HOSPITAL_BASED_OUTPATIENT_CLINIC_OR_DEPARTMENT_OTHER)
Admission: RE | Admit: 2022-03-26 | Discharge: 2022-03-26 | Disposition: A | Discharge status: Home / Self Care | Payer: Self-pay | Attending: Urology | Admitting: Urology

## 2022-03-26 ENCOUNTER — Ambulatory Visit (HOSPITAL_BASED_OUTPATIENT_CLINIC_OR_DEPARTMENT_OTHER): Payer: Self-pay | Admitting: Anesthesiology

## 2022-03-26 ENCOUNTER — Encounter (HOSPITAL_BASED_OUTPATIENT_CLINIC_OR_DEPARTMENT_OTHER)
Admission: RE | Disposition: A | Discharge status: Home / Self Care | Payer: Self-pay | Source: Home / Self Care | Attending: Urology

## 2022-03-26 ENCOUNTER — Encounter (HOSPITAL_BASED_OUTPATIENT_CLINIC_OR_DEPARTMENT_OTHER): Payer: Self-pay | Admitting: Urology

## 2022-03-26 DIAGNOSIS — N132 Hydronephrosis with renal and ureteral calculous obstruction: Secondary | ICD-10-CM

## 2022-03-26 DIAGNOSIS — Z87442 Personal history of urinary calculi: Secondary | ICD-10-CM | POA: Insufficient documentation

## 2022-03-26 DIAGNOSIS — N201 Calculus of ureter: Secondary | ICD-10-CM | POA: Insufficient documentation

## 2022-03-26 DIAGNOSIS — Z01818 Encounter for other preprocedural examination: Secondary | ICD-10-CM

## 2022-03-26 DIAGNOSIS — F17200 Nicotine dependence, unspecified, uncomplicated: Secondary | ICD-10-CM | POA: Insufficient documentation

## 2022-03-26 HISTORY — PX: CYSTOSCOPY/URETEROSCOPY/HOLMIUM LASER/STENT PLACEMENT: SHX6546

## 2022-03-26 SURGERY — CYSTOSCOPY/URETEROSCOPY/HOLMIUM LASER/STENT PLACEMENT
Anesthesia: General | Site: Ureter | Laterality: Right

## 2022-03-26 MED ORDER — TAMSULOSIN HCL 0.4 MG PO CAPS: 0.4000 mg | ORAL_CAPSULE | Freq: Every morning | ORAL | 1 refills | Status: AC

## 2022-03-26 MED ORDER — KETOROLAC TROMETHAMINE 30 MG/ML IJ SOLN
INTRAMUSCULAR | Status: DC | PRN
  Administered 2022-03-26: 30 mg via INTRAVENOUS

## 2022-03-26 MED ORDER — SODIUM CHLORIDE 0.9 % IR SOLN
Status: DC | PRN
  Administered 2022-03-26: 1500 mL

## 2022-03-26 MED ORDER — PROPOFOL 10 MG/ML IV BOLUS
INTRAVENOUS | Status: AC
  Filled 2022-03-26: qty 20

## 2022-03-26 MED ORDER — DEXAMETHASONE SODIUM PHOSPHATE 10 MG/ML IJ SOLN
INTRAMUSCULAR | Status: DC | PRN
  Administered 2022-03-26: 10 mg via INTRAVENOUS

## 2022-03-26 MED ORDER — FENTANYL CITRATE (PF) 100 MCG/2ML IJ SOLN
INTRAMUSCULAR | Status: AC
  Filled 2022-03-26: qty 2

## 2022-03-26 MED ORDER — DEXAMETHASONE SODIUM PHOSPHATE 10 MG/ML IJ SOLN
INTRAMUSCULAR | Status: AC
  Filled 2022-03-26: qty 1

## 2022-03-26 MED ORDER — 0.9 % SODIUM CHLORIDE (POUR BTL) OPTIME
TOPICAL | Status: DC | PRN
  Administered 2022-03-26: 500 mL

## 2022-03-26 MED ORDER — IOHEXOL 300 MG/ML  SOLN
INTRAMUSCULAR | Status: DC | PRN
  Administered 2022-03-26: 10 mL

## 2022-03-26 MED ORDER — LIDOCAINE 2% (20 MG/ML) 5 ML SYRINGE
INTRAMUSCULAR | Status: DC | PRN
  Administered 2022-03-26: 80 mg via INTRAVENOUS

## 2022-03-26 MED ORDER — PROPOFOL 10 MG/ML IV BOLUS
INTRAVENOUS | Status: DC | PRN
  Administered 2022-03-26: 150 mg via INTRAVENOUS

## 2022-03-26 MED ORDER — ONDANSETRON HCL 4 MG/2ML IJ SOLN
INTRAMUSCULAR | Status: AC
  Filled 2022-03-26: qty 2

## 2022-03-26 MED ORDER — KETOROLAC TROMETHAMINE 30 MG/ML IJ SOLN
INTRAMUSCULAR | Status: AC
  Filled 2022-03-26: qty 1

## 2022-03-26 MED ORDER — OXYCODONE HCL 5 MG PO CAPS: 5.0000 mg | ORAL_CAPSULE | ORAL | 0 refills | Status: AC | PRN

## 2022-03-26 MED ORDER — ONDANSETRON HCL 4 MG/2ML IJ SOLN
4.0000 mg | Freq: Once | INTRAMUSCULAR | Status: AC
  Administered 2022-03-26: 4 mg via INTRAVENOUS

## 2022-03-26 MED ORDER — OXYCODONE HCL 5 MG PO TABS
5.0000 mg | ORAL_TABLET | Freq: Once | ORAL | Status: AC
  Administered 2022-03-26: 5 mg via ORAL

## 2022-03-26 MED ORDER — CEFAZOLIN SODIUM-DEXTROSE 2-4 GM/100ML-% IV SOLN
INTRAVENOUS | Status: AC
  Filled 2022-03-26: qty 100

## 2022-03-26 MED ORDER — LACTATED RINGERS IV SOLN: INTRAVENOUS | Status: DC

## 2022-03-26 MED ORDER — ONDANSETRON HCL 4 MG/2ML IJ SOLN
INTRAMUSCULAR | Status: DC | PRN
  Administered 2022-03-26: 4 mg via INTRAVENOUS

## 2022-03-26 MED ORDER — ACETAMINOPHEN 500 MG PO TABS
ORAL_TABLET | ORAL | Status: AC
  Filled 2022-03-26: qty 2

## 2022-03-26 MED ORDER — LIDOCAINE HCL (PF) 2 % IJ SOLN
INTRAMUSCULAR | Status: AC
  Filled 2022-03-26: qty 5

## 2022-03-26 MED ORDER — MIDAZOLAM HCL 2 MG/2ML IJ SOLN
INTRAMUSCULAR | Status: AC
  Filled 2022-03-26: qty 2

## 2022-03-26 MED ORDER — OXYCODONE HCL 5 MG PO TABS
ORAL_TABLET | ORAL | Status: AC
  Filled 2022-03-26: qty 1

## 2022-03-26 MED ORDER — ACETAMINOPHEN 500 MG PO TABS
1000.0000 mg | ORAL_TABLET | Freq: Once | ORAL | Status: AC
  Administered 2022-03-26: 1000 mg via ORAL

## 2022-03-26 MED ORDER — FENTANYL CITRATE (PF) 100 MCG/2ML IJ SOLN
INTRAMUSCULAR | Status: DC | PRN
  Administered 2022-03-26 (×2): 50 ug via INTRAVENOUS

## 2022-03-26 MED ORDER — MIDAZOLAM HCL 5 MG/5ML IJ SOLN
INTRAMUSCULAR | Status: DC | PRN
  Administered 2022-03-26: 2 mg via INTRAVENOUS

## 2022-03-26 MED ORDER — CEFAZOLIN SODIUM-DEXTROSE 2-4 GM/100ML-% IV SOLN
2.0000 g | INTRAVENOUS | Status: AC
  Administered 2022-03-26: 2 g via INTRAVENOUS

## 2022-03-26 MED ORDER — FENTANYL CITRATE (PF) 100 MCG/2ML IJ SOLN
25.0000 ug | INTRAMUSCULAR | Status: DC | PRN
  Administered 2022-03-26 (×2): 50 ug via INTRAVENOUS

## 2022-03-26 SURGICAL SUPPLY — 19 items
BAG DRAIN URO-CYSTO SKYTR STRL (DRAIN) ×1 IMPLANT
BAG DRN UROCATH (DRAIN) ×1
CATH URETL OPEN END 6FR 70 (CATHETERS) IMPLANT
CLOTH BEACON ORANGE TIMEOUT ST (SAFETY) ×1 IMPLANT
FIBER LASER FLEXIVA 365 (UROLOGICAL SUPPLIES) IMPLANT
GLOVE BIO SURGEON STRL SZ7.5 (GLOVE) ×1 IMPLANT
GOWN STRL REUS W/TWL XL LVL3 (GOWN DISPOSABLE) IMPLANT
GUIDEWIRE STR DUAL SENSOR (WIRE) ×1 IMPLANT
IV NS IRRIG 3000ML ARTHROMATIC (IV SOLUTION) ×1 IMPLANT
KIT TURNOVER CYSTO (KITS) ×1 IMPLANT
MANIFOLD NEPTUNE II (INSTRUMENTS) ×1 IMPLANT
NS IRRIG 500ML POUR BTL (IV SOLUTION) ×1 IMPLANT
PACK CYSTO (CUSTOM PROCEDURE TRAY) ×1 IMPLANT
SHEATH URETERAL 12FRX35CM (MISCELLANEOUS) IMPLANT
STENT URET 6FRX26 CONTOUR (STENTS) IMPLANT
TRACTIP FLEXIVA PULS ID 200XHI (Laser) IMPLANT
TRACTIP FLEXIVA PULSE ID 200 (Laser) ×1
TUBE CONNECTING 12X1/4 (SUCTIONS) IMPLANT
TUBING UROLOGY SET (TUBING) IMPLANT

## 2022-03-26 NOTE — H&P (Signed)
CC/HPI: CC: Right-sided flank pain, history nephrolithiasis  HPI:  03/06/2022  36 year old male with a 2-week history of right-sided flank pain. He passed a kidney stone about 2 months ago. However, continuing to have right-sided flank pain. Was severe last night and almost went to the emergency department. Does have microscopic hematuria on urinalysis today. He denies any fever or vomiting. Occasional nausea. Vital signs stable here today.     ALLERGIES: No Allergies    MEDICATIONS: Tylenol      GU PSH: Cysto Remove Stent FB Sim - 2020 Ureteroscopic laser litho, Right - 2020 Ureteroscopic stone removal, Left - 2020        PSH Notes: No Surgical Problems    NON-GU PSH: None           GU PMH: Renal calculus - 2020, Right, He will need a metabolic w/u after he recovers from the stone removal. , - 2020 Ureteral calculus, Bilateral, 115m left distal and 480mright UVJ stones with non-progression and a 15m75mLP stone. I discussed MET, Staged ESWL or URS which could potentially address all of the stones at one time. We are going to get him set up for URS later this week with attempt to remove both the ureteral and right renal stones. I have reviewed the risks of ureteroscopy including bleeding, infection, ureteral injury, need for a stent or secondary procedures, thrombotic events and anesthetic complications. I have refilled his pain med. - 2020, Distal Ureteral Stone On The Left, - 2014 Hydronephrosis Unspec, Hydronephrosis, left - 2014 Other microscopic hematuria, Microscopic hematuria - 2014        PMH Notes:  1898-06-18 00:00:00 - Note: Normal Routine History And Physical Adult  2011-07-16 09:41:49 - Note: Closed Colles' Fracture Of The Right Wrist     NON-GU PMH: Personal history of (healed) traumatic fracture, History of fracture of pelvis - 2014 Renal leak hypercalciuria, Nephrocalcinosis - 2014      FAMILY HISTORY: 2 daughters - Other 1 son - Other Bladder Cancer - Father Family  Health Status - Mother's Age - Runs In Family Family Health Status Father - Runs In Family Family Health Status Number - Runs In Family Hematuria - Father Prostate Cancer - Father testicular cancer - Father     SOCIAL HISTORY: Marital Status: Married Preferred Language: English; Race: White Current Smoking Status: Patient smokes.  <DIV'  Tobacco Use Assessment Completed:  Used Tobacco in last 30 days?   Drinks 3 drinks per day.  Drinks 2 caffeinated drinks per day.       Notes: Tobacco use, Alcohol Use, Occupation:, Caffeine Use, Wishing To Stop Smoking, Using Marijuana, Marital History - Single     REVIEW OF SYSTEMS:      GU Review Male:  Patient reports leakage of urine, hard to postpone urination, and stream starts and stops. Patient denies burning/ pain with urination, trouble starting your stream, frequent urination, erection problems, have to strain to urinate , penile pain, and get up at night to urinate.     Gastrointestinal (Upper):  Patient reports nausea and vomiting. Patient denies indigestion/ heartburn.     Gastrointestinal (Lower):  Patient denies diarrhea and constipation.     Constitutional:  Patient denies fever, night sweats, weight loss, and fatigue.     Skin:  Patient denies skin rash/ lesion and itching.     Eyes:  Patient denies blurred vision and double vision.     Ears/ Nose/ Throat:  Patient denies sore throat and sinus problems.  Hematologic/Lymphatic:  Patient denies swollen glands and easy bruising.     Cardiovascular:  Patient denies leg swelling and chest pains.     Respiratory:  Patient denies cough and shortness of breath.     Endocrine:  Patient denies excessive thirst.     Musculoskeletal:  Patient reports back pain. Patient denies joint pain.     Neurological:  Patient denies headaches and dizziness.     Psychologic:  Patient denies depression and anxiety.     Notes: hematuria      VITAL SIGNS:        03/06/2022 02:04 PM      Weight 130 lb /  58.97 kg      Height 69 in / 175.26 cm      BP 133/87 mmHg      Heart Rate 71 /min      Temperature 98.1 F / 36.7 C      BMI 19.2 kg/m      MULTI-SYSTEM PHYSICAL EXAMINATION:       Constitutional: Well-nourished. No physical deformities. Normally developed. Good grooming.      Respiratory: No labored breathing, no use of accessory muscles.       Cardiovascular: Normal temperature, normal extremity pulses, no swelling, no varicosities.      Skin: No paleness, no jaundice, no cyanosis. No lesion, no ulcer, no rash.      Neurologic / Psychiatric: Oriented to time, oriented to place, oriented to person. No depression, no anxiety, no agitation.      Gastrointestinal: No mass, no tenderness, no rigidity, non obese abdomen. No CVA tenderness bilaterally      Eyes: Normal conjunctivae. Normal eyelids.      Musculoskeletal: Normal gait and station of head and neck.              Complexity of Data:   Source Of History:  Patient  Records Review:  Previous Doctor Records, Previous Patient Records  Urine Test Review:  Urinalysis   PROCEDURES:    Urinalysis w/Scope - 81001  Dipstick Dipstick Cont'd Micro  Color: Yellow Bilirubin: Neg WBC/hpf: 0 - 5/hpf  Appearance: Slightly Cloudy Ketones: Neg RBC/hpf: 3 - 10/hpf  Specific Gravity: 1.010 Blood: 3+ Bacteria: Mod (26-50/hpf)  pH: 7.0 Protein: Neg Cystals: Amorph Phosphates  Glucose: Neg Urobilinogen: 0.2 Casts: NS (Not Seen)   Nitrites: Neg Trichomonas: Not Present   Leukocyte Esterase: Trace Mucous: Present    Epithelial Cells: NS (Not Seen)    Yeast: NS (Not Seen)    Sperm: Not Present   Notes:       Ketoralac 98m - [id number] Qty: 60 Adm. By: EAlcide Goodness Unit: mg Lot No [id number] Route: IM Exp. Date 08/17/2022  Freq: None Mfgr.:   Site: Right Hip   ASSESSMENT:     ICD-10 Details  1 GU:  Flank Pain - R10.84 Undiagnosed New Problem  2  History of urolithiasis - Z87.442 Undiagnosed New Problem  3  Other microscopic  hematuria - R31.29 Undiagnosed New Problem   PLAN:   Medications  New Meds: Tamsulosin Hcl 0.4 mg capsule 1 capsule PO Daily #15 3 Refill(s)  Oxycodone Hcl 5 mg tablet 1 tablet PO Q 6 H PRN For pain #15 0 Refill(s)     Pharmacy Name:  [id number]  [address]  [address] [address]  Phone:  [phone]  Fax:  [phone] Orders  Labs Urine Culture  X-Rays: C.T. Stone Protocol Without I.V. Contrast  Schedule    Document  Letter(s):  Created for Patient: Clinical Summary   Notes:  Obtain CT scan of the abdomen and pelvis without contrast. We will call with the results.   Start Flomax and oxycodone as needed for pain.      Signed by Link Snuffer, III, M.D. on 03/07/22 at 5:04 PM (EDT

## 2022-03-26 NOTE — Anesthesia Procedure Notes (Signed)
Procedure Name: LMA Insertion Date/Time: 03/26/2022 10:12 AM  Performed by: Bonney Aid, CRNAPre-anesthesia Checklist: Patient identified, Emergency Drugs available, Suction available and Patient being monitored Patient Re-evaluated:Patient Re-evaluated prior to induction Oxygen Delivery Method: Circle system utilized Preoxygenation: Pre-oxygenation with 100% oxygen Induction Type: IV induction Ventilation: Mask ventilation without difficulty LMA: LMA inserted LMA Size: 4.0 Number of attempts: 1 Airway Equipment and Method: Bite block Placement Confirmation: positive ETCO2 Tube secured with: Tape Dental Injury: Teeth and Oropharynx as per pre-operative assessment

## 2022-03-26 NOTE — Discharge Instructions (Addendum)
[facility] [phone] Post Ureteroscopy With or Without Stent Instructions  Definitions:  Ureter: The duct that transports urine from the kidney to the bladder. Stent:   A plastic hollow tube that is placed into the ureter, from the kidney to the                 bladder to prevent the ureter from swelling shut.  GENERAL INSTRUCTIONS:  Despite the fact that no skin incisions were used, the area around the ureter and bladder is raw and irritated. The stent is a foreign body which will further irritate the bladder wall. This irritation is manifested by increased frequency of urination, both day and night, and by an increase in the urge to urinate. In some, the urge to urinate is present almost always. Sometimes the urge is strong enough that you may not be able to stop yourself from urinating. The only real cure is to remove the stent and then give time for the bladder wall to heal which can't be done until the danger of the ureter swelling shut has passed, which varies.  You may see some blood in your urine while the stent is in place and a few days afterwards. Do not be alarmed, even if the urine was clear for a while. Get off your feet and drink lots of fluids until clearing occurs. If you start to pass clots or don't improve, call us.  DIET: You may return to your normal diet immediately. Because of the raw surface of your bladder, alcohol, spicy foods, acid type foods and drinks with caffeine may cause irritation or frequency and should be used in moderation. To keep your urine flowing freely and to avoid constipation, drink plenty of fluids during the day ( 8-10 glasses ). Tip: Avoid cranberry juice because it is very acidic.  ACTIVITY: Your physical activity doesn't need to be restricted. However, if you are very active, you may see some blood in your urine. We suggest that you reduce your activity under these circumstances until the bleeding has stopped.  BOWELS: It is  important to keep your bowels regular during the postoperative period. Straining with bowel movements can cause bleeding. A bowel movement every other day is reasonable. Use a mild laxative if needed, such as Milk of Magnesia 2-3 tablespoons, or 2 Dulcolax tablets. Call if you continue to have problems. If you have been taking narcotics for pain, before, during or after your surgery, you may be constipated. Take a laxative if necessary.   MEDICATION: You should resume your pre-surgery medications unless told not to. You may take oxybutynin or flomax if prescribed for bladder spasms or discomfort from the stent Take pain medication as directed for pain refractory to conservative management  PROBLEMS YOU SHOULD REPORT TO US: Fevers over 100.5 Fahrenheit. Heavy bleeding, or clots ( See above notes about blood in urine ). Inability to urinate. Drug reactions ( hives, rash, nausea, vomiting, diarrhea ). Severe burning or pain with urination that is not improving.   Post Anesthesia Home Care Instructions  Activity: Get plenty of rest for the remainder of the day. A responsible individual must stay with you for 24 hours following the procedure.  For the next 24 hours, DO NOT: -Drive a car -Operate machinery -Drink alcoholic beverages -Take any medication unless instructed by your physician -Make any legal decisions or sign important papers.  Meals: Start with liquid foods such as gelatin or soup. Progress to regular foods as tolerated. Avoid greasy, spicy,   heavy foods. If nausea and/or vomiting occur, drink only clear liquids until the nausea and/or vomiting subsides. Call your physician if vomiting continues.  Special Instructions/Symptoms: Your throat may feel dry or sore from the anesthesia or the breathing tube placed in your throat during surgery. If this causes discomfort, gargle with warm salt water. The discomfort should disappear within 24 hours.      No ibuprofen, Advil, Aleve,  Motrin, ketorolac, meloxicam, naproxen, or other NSAIDS until after 4:30 pm today if needed.  No acetaminophen/Tylenol until after 3:45 pm today if needed.

## 2022-03-26 NOTE — Op Note (Signed)
Operative Note  Preoperative diagnosis:  1.  Right ureteropelvic junction calculus  Postoperative diagnosis: 1.  Right ureteropelvic junction calculus  Procedure(s): 1.  Cystoscopy with right retrograde pyelogram, right ureteroscopy with laser lithotripsy, ureteral stent placement  Surgeon: Link Snuffer, MD  Assistants: None  Anesthesia: General  Complications: None immediate  EBL: Minimal  Specimens: 1.  None  Drains/Catheters: 1.  6 x 26 double-J ureteral stent  Intraoperative findings: 1.  Normal urethra and bladder 2.  Right retrograde pyelogram revealed mild hydronephrosis.  There was no filling defect after treatment of the stone. 3.  1 cm radiopaque calculus in the ureteropelvic junction on the right that was laser fragmented on dust settings to tiny fragments.  Indication: 36 year old male with a right ureteropelvic junction calculus presents for the previously mentioned operation.  Description of procedure:  The patient was identified and consent was obtained.  The patient was taken to the operating room and placed in the supine position.  The patient was placed under general anesthesia.  Perioperative antibiotics were administered.  The patient was placed in dorsal lithotomy.  Patient was prepped and draped in a standard sterile fashion and a timeout was performed.  A 21 French rigid cystoscope was advanced into the urethra and into the bladder.  Complete cystoscopy was performed with no abnormal findings.  The right ureter was cannulated with a sensor wire which was advanced up to the kidney under fluoroscopic guidance.  A second wire was advanced alongside this and up to the kidney under fluoroscopic guidance.  Scope was withdrawn.  One of the wires was secured to the drape as a safety wire.  The other wire was used to advance an 11 x 13 ureteral access sheath over the wire under continuous fluoroscopic guidance up to the proximal ureter.  Inner sheath and wire were  withdrawn.  Digital ureteroscopy was performed and the stone was laser fragmented on dust settings to tiny fragments.  All stone was treated.  I shot a retrograde pyelogram through the scope with findings noted above.  I withdrew the scope along with the access sheath visualizing the entire ureter upon removal.  There were no ureteral calculi and no ureteral injury was seen.  I backloaded the wire onto rigid cystoscope and advanced that into the bladder followed by routine placement of a 6 x 26 double-J ureteral stent.  I drained the bladder and withdrew the scope.  Patient tolerated the procedure well and was stable postoperative.  Plan: Follow-up in 1 week for stent removal.

## 2022-03-26 NOTE — Interval H&P Note (Signed)
History and Physical Interval Note:  03/26/2022 9:57 AM  Edgar Campbell  has presented today for surgery, with the diagnosis of RIGHT RENAL STONE.  The various methods of treatment have been discussed with the patient and family. After consideration of risks, benefits and other options for treatment, the patient has consented to  Procedure(s) with comments: CYSTOSCOPY RIGHT URETEROSCOPY/HOLMIUM LASER/STENT PLACEMENT (Right) - 1 HR FOR CASE as a surgical intervention.  The patient's history has been reviewed, patient examined, no change in status, stable for surgery.  I have reviewed the patient's chart and labs.  Questions were answered to the patient's satisfaction.    Patient thought about his options of ESWL versus ureteroscopy and has elected to proceed with ureteroscopy.  Understands the risk of bleeding, infection, injury to surrounding structures including ureteral evulsion, need for additional procedures, possibility of staged procedure.   Marton Redwood, III

## 2022-03-26 NOTE — Anesthesia Preprocedure Evaluation (Addendum)
Anesthesia Evaluation  Patient identified by MRN, date of birth, ID band Patient awake    Reviewed: Allergy & Precautions, H&P , NPO status , Patient's Chart, lab work & pertinent test results  Airway Mallampati: I  TM Distance: >3 FB Neck ROM: Full    Dental no notable dental hx. (+) Teeth Intact, Dental Advisory Given   Pulmonary Current SmokerPatient did not abstain from smoking.,    Pulmonary exam normal breath sounds clear to auscultation       Cardiovascular negative cardio ROS   Rhythm:Regular Rate:Normal     Neuro/Psych negative neurological ROS  negative psych ROS   GI/Hepatic negative GI ROS, Neg liver ROS,   Endo/Other  negative endocrine ROS  Renal/GU Renal disease  negative genitourinary   Musculoskeletal   Abdominal   Peds  Hematology negative hematology ROS (+)   Anesthesia Other Findings   Reproductive/Obstetrics negative OB ROS                            Anesthesia Physical Anesthesia Plan  ASA: 2  Anesthesia Plan: General   Post-op Pain Management: Tylenol PO (pre-op)* and Toradol IV (intra-op)*   Induction: Intravenous  PONV Risk Score and Plan: 2 and Ondansetron, Dexamethasone and Midazolam  Airway Management Planned: LMA  Additional Equipment:   Intra-op Plan:   Post-operative Plan: Extubation in OR  Informed Consent: I have reviewed the patients History and Physical, chart, labs and discussed the procedure including the risks, benefits and alternatives for the proposed anesthesia with the patient or authorized representative who has indicated his/her understanding and acceptance.     Dental advisory given  Plan Discussed with: CRNA  Anesthesia Plan Comments:        Anesthesia Quick Evaluation

## 2022-03-26 NOTE — Transfer of Care (Signed)
Immediate Anesthesia Transfer of Care Note  Patient: Edgar Campbell  Procedure(s) Performed: CYSTOSCOPY RIGHT URETEROSCOPY/HOLMIUM LASER/STENT PLACEMENT, RIGHT RETROGRADE PYLOGRAM (Right: Ureter)  Patient Location: PACU  Anesthesia Type:General  Level of Consciousness: awake, alert  and oriented  Airway & Oxygen Therapy: Patient Spontanous Breathing and Patient connected to nasal cannula oxygen  Post-op Assessment: Report given to RN  Post vital signs: Reviewed and stable  Last Vitals:  Vitals Value Taken Time  BP 152/100   Temp    Pulse 70 03/26/22 1109  Resp 16 03/26/22 1109  SpO2 100 % 03/26/22 1109  Vitals shown include unvalidated device data.  Last Pain:  Vitals:   03/26/22 0909  TempSrc: Oral  PainSc: 2       Patients Stated Pain Goal: 7 (57/90/38 3338)  Complications: No notable events documented.

## 2022-03-26 NOTE — Anesthesia Postprocedure Evaluation (Signed)
Anesthesia Post Note  Patient: Edgar Campbell  Procedure(s) Performed: CYSTOSCOPY RIGHT URETEROSCOPY/HOLMIUM LASER/STENT PLACEMENT, RIGHT RETROGRADE PYLOGRAM (Right: Ureter)     Patient location during evaluation: PACU Anesthesia Type: General Level of consciousness: awake and alert Pain management: pain level controlled Vital Signs Assessment: post-procedure vital signs reviewed and stable Respiratory status: spontaneous breathing, nonlabored ventilation and respiratory function stable Cardiovascular status: blood pressure returned to baseline and stable Postop Assessment: no apparent nausea or vomiting Anesthetic complications: no   No notable events documented.  Last Vitals:  Vitals:   03/26/22 1140 03/26/22 1145  BP:    Pulse: (!) 51 (!) 55  Resp: 12 13  Temp:    SpO2: 100% 100%    Last Pain:  Vitals:   03/26/22 1140  TempSrc:   PainSc: 5                  Polo Mcmartin,W. EDMOND

## 2022-03-27 ENCOUNTER — Encounter (HOSPITAL_BASED_OUTPATIENT_CLINIC_OR_DEPARTMENT_OTHER): Payer: Self-pay | Admitting: Urology
